# Patient Record
Sex: Male | Born: 1965 | Race: White | Hispanic: No | Marital: Married | Smoking: Former smoker
Health system: Southern US, Community
[De-identification: ages and names within clinical notes are randomized; demographics above are authoritative.]

## PROBLEM LIST (undated history)

## (undated) DIAGNOSIS — R7301 Impaired fasting glucose: Secondary | ICD-10-CM

## (undated) DIAGNOSIS — Z8701 Personal history of pneumonia (recurrent): Secondary | ICD-10-CM

## (undated) DIAGNOSIS — I10 Essential (primary) hypertension: Secondary | ICD-10-CM

## (undated) DIAGNOSIS — E7800 Pure hypercholesterolemia, unspecified: Secondary | ICD-10-CM

## (undated) DIAGNOSIS — E669 Obesity, unspecified: Secondary | ICD-10-CM

## (undated) DIAGNOSIS — I639 Cerebral infarction, unspecified: Secondary | ICD-10-CM

## (undated) DIAGNOSIS — I7771 Dissection of carotid artery: Secondary | ICD-10-CM

## (undated) DIAGNOSIS — G4733 Obstructive sleep apnea (adult) (pediatric): Secondary | ICD-10-CM

## (undated) DIAGNOSIS — Z9989 Dependence on other enabling machines and devices: Secondary | ICD-10-CM

## (undated) DIAGNOSIS — Z8619 Personal history of other infectious and parasitic diseases: Secondary | ICD-10-CM

## (undated) HISTORY — DX: Personal history of pneumonia (recurrent): Z87.01

## (undated) HISTORY — DX: Dissection of carotid artery: I77.71

## (undated) HISTORY — DX: Personal history of other infectious and parasitic diseases: Z86.19

## (undated) HISTORY — DX: Impaired fasting glucose: R73.01

## (undated) HISTORY — PX: WISDOM TOOTH EXTRACTION: SHX21

## (undated) HISTORY — DX: Dependence on other enabling machines and devices: Z99.89

## (undated) HISTORY — DX: Gilbert syndrome: E80.4

## (undated) HISTORY — DX: Pure hypercholesterolemia, unspecified: E78.00

## (undated) HISTORY — PX: CHOLECYSTECTOMY: SHX55

## (undated) HISTORY — PX: VASECTOMY: SHX75

## (undated) HISTORY — DX: Obstructive sleep apnea (adult) (pediatric): G47.33

## (undated) HISTORY — DX: Obesity, unspecified: E66.9

---

## 2014-04-22 HISTORY — PX: ELECTROCARDIOGRAM: SHX264

## 2014-04-27 DIAGNOSIS — I639 Cerebral infarction, unspecified: Secondary | ICD-10-CM

## 2014-04-27 HISTORY — DX: Cerebral infarction, unspecified: I63.9

## 2014-04-27 HISTORY — PX: OTHER SURGICAL HISTORY: SHX169

## 2014-08-23 HISTORY — PX: CAROTID STENT: SHX1301

## 2015-04-29 LAB — CBC AND DIFFERENTIAL
HCT: 45 (ref 41–53)
Platelets: 182 (ref 150–399)

## 2016-11-17 ENCOUNTER — Emergency Department
Admission: EM | Admit: 2016-11-17 | Discharge: 2016-11-17 | Disposition: A | Discharge status: Home / Self Care | Payer: 59 | Source: Home / Self Care | Attending: Family Medicine | Admitting: Family Medicine

## 2016-11-17 ENCOUNTER — Encounter: Payer: Self-pay | Admitting: *Deleted

## 2016-11-17 ENCOUNTER — Emergency Department (INDEPENDENT_AMBULATORY_CARE_PROVIDER_SITE_OTHER): Payer: 59

## 2016-11-17 DIAGNOSIS — I10 Essential (primary) hypertension: Secondary | ICD-10-CM

## 2016-11-17 DIAGNOSIS — R0602 Shortness of breath: Secondary | ICD-10-CM | POA: Diagnosis not present

## 2016-11-17 DIAGNOSIS — R05 Cough: Secondary | ICD-10-CM

## 2016-11-17 DIAGNOSIS — R053 Chronic cough: Secondary | ICD-10-CM

## 2016-11-17 DIAGNOSIS — J9801 Acute bronchospasm: Secondary | ICD-10-CM

## 2016-11-17 HISTORY — DX: Essential (primary) hypertension: I10

## 2016-11-17 HISTORY — DX: Cerebral infarction, unspecified: I63.9

## 2016-11-17 MED ORDER — PREDNISONE 20 MG PO TABS: ORAL_TABLET | ORAL | 0 refills | Status: DC

## 2016-11-17 MED ORDER — AZITHROMYCIN 250 MG PO TABS
ORAL_TABLET | ORAL | 0 refills | Status: DC
Start: 2016-11-17 — End: 2016-12-11

## 2016-11-17 NOTE — ED Provider Notes (Signed)
[facility]    CSN: [id number] Arrival date & time: 11/17/16  1706     History   Chief Complaint Chief Complaint  Patient presents with  . Cough    HPI Mark Gilmore is a 51 y.o. male.   Patient developed a cold-like illness about a month ago with typical initial symptoms of sore throat and sinus congestion followed by a cough about 4 days later.  However, the cough has persisted although he feels well otherwise.  No fevers, chills, and sweats.  No pleuritic pain, although he reports occasional shortness of breath.   No history of asthma, although his daughter has asthma.  He has even used his daughter's albuterol nebulizer with improvement.  He has a history of bronchitis and pneumonia.   The history is provided by the patient.    Past Medical History:  Diagnosis Date  . Hypertension   . Stroke [facility](H[facility])     There are no active problems to display for this patient.   Past Surgical History:  Procedure Laterality Date  . CAROTID STENT         Home Medications    Prior to Admission medications   Medication Sig Start Date End Date Taking? Authorizing Provider  amLODipine (NORVASC) 5 MG tablet Take 5 mg by mouth daily.   Yes [provider]  hydrochlorothiazide (HYDRODIURIL) 25 MG tablet Take 25 mg by mouth daily.   Yes [provider]  lisinopril (PRINIVIL,ZESTRIL) 40 MG tablet Take 40 mg by mouth daily.   Yes [provider]  azithromycin (ZITHROMAX Z-PAK) 250 MG tablet Take 2 tabs today; then begin one tab once daily for 4 more days. 11/17/16   Lattie HawBeese, Natoshia Souter A, MD  predniSONE (DELTASONE) 20 MG tablet Take one tab by mouth twice daily for 5 days, then one daily. Take with food. 11/17/16   Lattie HawBeese, Kimala Horne A, MD    Family History Family History  Problem Relation Age of Onset  . Hypertension Mother   . Diabetes Mother   . Hypertension Father   . Diabetes Father     Social History Social History  Substance Use Topics  .  Smoking status: Former Smoker    Quit date: 11/17/1996  . Smokeless tobacco: Never Used  . Alcohol use No     Allergies   Patient has no known allergies.   Review of Systems Review of Systems  No sore throat + cough No pleuritic pain No wheezing + nasal congestion, improved No post-nasal drainage No sinus pain/pressure No itchy/red eyes No earache No hemoptysis + SOB No fever/chills No nausea No vomiting No abdominal pain No diarrhea No urinary symptoms No skin rash No fatigue No myalgias No headache Used OTC meds without relief    Physical Exam Triage Vital Signs ED Triage Vitals [11/17/16 1738]  Enc Vitals Group     BP 114/80     Pulse Rate 77     Resp 18     Temp 98.5 F (36.9 C)     Temp Source Oral     SpO2 97 %     Weight 204 lb (92.5 kg)     Height 5\' 8"  (1.727 m)     Head Circumference      Peak Flow      Pain Score 0     Pain Loc      Pain Edu?      Excl. in GC?    No data found.   Updated Vital Signs  BP 114/80 (BP Location: Left Arm)   Pulse 77   Temp 98.5 F (36.9 C) (Oral)   Resp 18   Ht 5\' 8"  (1.727 m)   Wt 204 lb (92.5 kg)   SpO2 97%   BMI 31.02 kg/m   Visual Acuity Right Eye Distance:   Left Eye Distance:   Bilateral Distance:    Right Eye Near:   Left Eye Near:    Bilateral Near:     Physical Exam Nursing notes and Vital Signs reviewed. Appearance:  Patient appears stated age, and in no acute distress Eyes:  Pupils are equal, round, and reactive to light and accomodation.  Extraocular movement is intact.  Conjunctivae are not inflamed  Ears:  Canals normal.  Tympanic membranes normal.  Nose:  Mildly congested turbinates.  No sinus tenderness.   Pharynx:  Normal Neck:  Supple.  No adenopathy.  Lungs:  Clear to auscultation.  Breath sounds are equal.  Moving air well. Heart:  Regular rate and rhythm without murmurs, rubs, or gallops.  Abdomen:  Nontender without masses or hepatosplenomegaly.  Bowel sounds are  present.  No CVA or flank tenderness.  Extremities:  No edema.  Skin:  No rash present.    UC Treatments / Results  Labs (all labs ordered are listed, but only abnormal results are displayed) Labs Reviewed - No data to display  EKG  EKG Interpretation None       Radiology No results found.  Procedures Procedures (including critical care time)  Medications Ordered in UC Medications - No data to display   Initial Impression / Assessment and Plan / UC Course  I have reviewed the triage vital signs and the nursing notes.  Pertinent labs & imaging results that were available during my care of the patient were reviewed by me and considered in my medical decision making (see chart for details).    Begin empiric Z-pak for atypical coverage, and prednisone burst/taper. Take plain guaifenesin (1200mg  extended release tabs such as Mucinex) twice daily, with plenty of water, for cough and congestion.  Get adequate rest.   May take Delsym Cough Suppressant at bedtime for nighttime cough.  Stop all antihistamines for now, and other non-prescription cough/cold preparations.   Follow-up with family doctor if not improving about10 days.     Final Clinical Impressions(s) / UC Diagnoses   Final diagnoses:  Persistent cough for 3 weeks or longer  Bronchospasm    New Prescriptions Discharge Medication List as of 11/17/2016  6:24 PM    START taking these medications   Details  azithromycin (ZITHROMAX Z-PAK) 250 MG tablet Take 2 tabs today; then begin one tab once daily for 4 more days., Normal    predniSONE (DELTASONE) 20 MG tablet Take one tab by mouth twice daily for 5 days, then one daily. Take with food., Normal            Lattie Haw, MD 11/21/16 [phone]

## 2016-11-17 NOTE — ED Triage Notes (Signed)
Pt c/o productive cough and SOB x 1 mth. Denies fever. He has used his sons Albuterol nebs. He reports a hx of bronchitis and pneumonia.

## 2016-11-17 NOTE — Discharge Instructions (Signed)
Take plain guaifenesin (1200mg  extended release tabs such as Mucinex) twice daily, with plenty of water, for cough and congestion.  Get adequate rest.   May take Delsym Cough Suppressant at bedtime for nighttime cough.  Stop all antihistamines for now, and other non-prescription cough/cold preparations.   Follow-up with family doctor if not improving about10 days.

## 2016-12-11 ENCOUNTER — Encounter: Payer: Self-pay | Admitting: Family Medicine

## 2016-12-11 ENCOUNTER — Other Ambulatory Visit: Payer: Self-pay

## 2016-12-11 ENCOUNTER — Ambulatory Visit: Payer: 59 | Admitting: Family Medicine

## 2016-12-11 VITALS — BP 102/68 | HR 89 | Temp 98.0°F | Resp 16 | Ht 67.5 in | Wt 205.5 lb

## 2016-12-11 DIAGNOSIS — Z95828 Presence of other vascular implants and grafts: Secondary | ICD-10-CM

## 2016-12-11 DIAGNOSIS — Z9889 Other specified postprocedural states: Secondary | ICD-10-CM | POA: Diagnosis not present

## 2016-12-11 DIAGNOSIS — I10 Essential (primary) hypertension: Secondary | ICD-10-CM | POA: Diagnosis not present

## 2016-12-11 DIAGNOSIS — Z8673 Personal history of transient ischemic attack (TIA), and cerebral infarction without residual deficits: Secondary | ICD-10-CM

## 2016-12-11 DIAGNOSIS — Z9989 Dependence on other enabling machines and devices: Secondary | ICD-10-CM

## 2016-12-11 DIAGNOSIS — G4733 Obstructive sleep apnea (adult) (pediatric): Secondary | ICD-10-CM

## 2016-12-11 DIAGNOSIS — Z8679 Personal history of other diseases of the circulatory system: Secondary | ICD-10-CM | POA: Diagnosis not present

## 2016-12-11 NOTE — Progress Notes (Signed)
Office Note 12/11/2016  CC:  Chief Complaint  Patient presents with  . Establish Care  . Referral    Neurologist for CVA f/u, Cardiologist, Pulmonologist for Sleep Apnea/Sleep Study?  . Follow-up    [facility]   HPI:  Mark Gilmore is a 51 y.o. male who is here to establish care Patient's most recent primary [facility], Dr. Danne HarborNestleroad. Old records were not reviewed prior to or during today's visit.  No acute complaints. 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 Has had cardiology monitoring with carotid dopplers annually since then.  As far as pt knows, these dopplers have been normal/stable.  Says bp has historically been well controlled ever since CVA, but says prior to it he feels like it was likely not well controlled.  Denies hx of hyperlipidemia or diabetes.  Smoked 1/2 ppd for about 5 yrs in remote past.   Has OSA and is on CPAP--says he needs rx for new supplies/mask.  Most recent CPE with labs was about 1 yr ago. He states he wants to return at a different date to get CPE with fasting labs.  Past Medical History:  Diagnosis Date  . Hypertension   . OSA on CPAP   . Stroke (HCC) 04/2014   difficulty with word search, tightness in R arm.  Some residual spasticity in R arm.    Past Surgical History:  Procedure Laterality Date  . CAROTID STENT Left   . CHOLECYSTECTOMY  2006?    Family History  Problem Relation Age of Onset  . Hypertension Mother   . Diabetes Mother   . Stroke Mother   . Dementia Mother   . Hypertension Father   . Diabetes Father   . Hypertension Sister   . Diabetes Sister     Social History   Socioeconomic History  . Marital status: Married    Spouse name: Not on file  . Number of children: Not on file  . Years of education: Not on file  . Highest education level: Not on file  Social Needs  . Financial resource strain: Not on file  . Food insecurity - worry:  Not on file  . Food insecurity - inability: Not on file  . Transportation needs - medical: Not on file  . Transportation needs - non-medical: Not on file  Occupational History  . Not on file  Tobacco Use  . Smoking status: Former Smoker    Last attempt to quit: 11/17/1996    Years since quitting: 20.0  . Smokeless tobacco: Former Engineer, waterUser  Substance and Sexual Activity  . Alcohol use: No  . Drug use: No  . Sexual activity: Not on file  Other Topics Concern  . Not on file  Social History Narrative   Married, 4 kids.   [place].   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   Tob: 5 pack yr hx, quit about 1998.   No alcohol or drugs.    Outpatient Encounter Medications as of 12/11/2016  Medication Sig  . amLODipine (NORVASC) 5 MG tablet Take 5 mg by mouth daily.  Marland Kitchen. aspirin EC 81 MG tablet Take 81 mg 2 (two) times daily by mouth.  . hydrochlorothiazide (HYDRODIURIL) 25 MG tablet Take 25 mg by mouth daily.  Marland Kitchen. lisinopril (PRINIVIL,ZESTRIL) 40 MG tablet Take 40 mg by mouth daily.  . [DISCONTINUED] azithromycin (ZITHROMAX Z-PAK)  250 MG tablet Take 2 tabs today; then begin one tab once daily for 4 more days. (Patient not taking: Reported on 12/11/2016)  . [DISCONTINUED] predniSONE (DELTASONE) 20 MG tablet Take one tab by mouth twice daily for 5 days, then one daily. Take with food. (Patient not taking: Reported on 12/11/2016)   No facility-administered encounter medications on file as of 12/11/2016.     No Known Allergies  ROS Review of Systems  Constitutional: Negative for fatigue and fever.  HENT: Negative for congestion and sore throat.   Eyes: Negative for visual disturbance.  Respiratory: Negative for cough and shortness of breath.   Cardiovascular: Negative for chest pain and leg swelling.  Gastrointestinal: Negative for abdominal pain and nausea.  Genitourinary: Negative for dysuria.  Musculoskeletal: Negative for  back pain and joint swelling.  Skin: Negative for rash.  Neurological: Negative for dizziness, speech difficulty, weakness, numbness and headaches.  Hematological: Negative for adenopathy.    PE; Blood pressure 102/68, pulse 89, temperature 98 F (36.7 C), temperature source Oral, resp. rate 16, height 5' 7.5" (1.715 m), weight 205 lb 8 oz (93.2 kg), SpO2 95 %. Gen: Alert, well appearing.  Patient is oriented to person, place, time, and situation. AFFECT: pleasant, lucid thought and speech. Neck: supple/nontender.  No LAD, mass, or TM.  Carotid pulses 1+ bilaterally, without bruits. CV: RRR, no m/r/g.   LUNGS: CTA bilat, nonlabored resps, good aeration in all lung fields. ABD: soft, NT/ND EXT: no clubbing, cyanosis, or edema.  SKIN: no pallor or jaundice.  Pertinent labs:  None  ASSESSMENT AND PLAN:   New pt; lots of old records to obtain.  1) History of left carotid artery dissection, with subsequent carotid stent placement--2016. Will refer to cardiologist for ongoing monitoring/management of this. Pt states his next carotid dopplers would be due around Feb 2019.  2) Hx of CVA: from carotid artery dissection.  Presumably was not found to have significant PAD or cerebrovascular atherosclerosis--he is not on a statin.  Will continue with RF management: goal bp <130/80, LDL <70, normal A1c, ongoing antiplatelet treatment.  Continue 162 mg ASA qd. Good bp control on amlod, hctz, lisin.  No refills needed at this time.  3) HTN: The current medical regimen is effective;  continue present plan and medications. We'll be checking lytes/cr at CPE that he'll schedule soon.  4) OSA on CPAP: needs new mask/supplies.  He'll need to give sleep center information so we can obtain sleep study report details, then [facility]  An After Visit Summary was printed and given to the patient.  Return for patient to make appt for fasting CPE at his convenience.  Signed:   Santiago BumpersPhil [name], MD           12/11/2016

## 2016-12-30 ENCOUNTER — Telehealth: Payer: Self-pay | Admitting: *Deleted

## 2016-12-30 ENCOUNTER — Encounter: Payer: Self-pay | Admitting: *Deleted

## 2016-12-30 ENCOUNTER — Encounter: Payer: Self-pay | Admitting: Family Medicine

## 2016-12-30 NOTE — Telephone Encounter (Signed)
[facility]   I reviewed records and abstracted information into pts chart.   Records have been placed on Dr. Samul DadaMcGowen's desk for review.

## 2017-01-05 ENCOUNTER — Ambulatory Visit: Payer: 59 | Admitting: Cardiovascular Disease

## 2017-01-06 ENCOUNTER — Other Ambulatory Visit: Payer: Self-pay | Admitting: Family Medicine

## 2017-02-02 ENCOUNTER — Encounter: Payer: Self-pay | Admitting: *Deleted

## 2017-02-02 ENCOUNTER — Ambulatory Visit: Payer: 59 | Admitting: Cardiovascular Disease

## 2017-02-02 VITALS — BP 121/84 | HR 70 | Ht 67.5 in | Wt 208.0 lb

## 2017-02-02 DIAGNOSIS — I10 Essential (primary) hypertension: Secondary | ICD-10-CM

## 2017-02-02 DIAGNOSIS — Z9989 Dependence on other enabling machines and devices: Secondary | ICD-10-CM | POA: Insufficient documentation

## 2017-02-02 DIAGNOSIS — I7771 Dissection of carotid artery: Secondary | ICD-10-CM

## 2017-02-02 DIAGNOSIS — G4733 Obstructive sleep apnea (adult) (pediatric): Secondary | ICD-10-CM | POA: Insufficient documentation

## 2017-02-02 DIAGNOSIS — E669 Obesity, unspecified: Secondary | ICD-10-CM | POA: Insufficient documentation

## 2017-02-02 NOTE — Assessment & Plan Note (Signed)
History of left carotid artery dissection April 2016 with stenting 3 months later. This is followed by Dubowitz ultrasound. [facility] [place]

## 2017-02-02 NOTE — Assessment & Plan Note (Signed)
History of obstructive sleep apnea on CPAP. 

## 2017-02-02 NOTE — Progress Notes (Signed)
02/02/2017 Georgina Pillion   August 08, 1965  [id number]  Primary Physician [name], Maryjean Morn, MD Primary Cardiologist: Runell Gess MD Nicholes Cal[place], [place]  HPI:  Mark Gilmore is a 52 y.o. mild to moderately overweight married Caucasian male father of 2 young daughters referred by Dr. Marvel Plan for cardiovascular evaluation because of a history of carotid artery dissection and subsequent stenting. 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 He recently relocated from [place] to [place] in August 2018. His only cardiac risk factor is treated hypertension. He does have obstructive sleep apnea on CPAP. Never had a heart attack. He did have a spontaneous left carotid dissection April 2016 was on Coumadin for a short period time. He then underwent coronary artery stenting in July of that year [facility]. This followed noninvasively since then he has been asymptomatic with only mild right upper extremity residual.    Current Meds  Medication Sig  . amLODipine (NORVASC) 5 MG tablet TAKE 1 TABLET BY MOUTH EVERY DAY  . aspirin EC 81 MG tablet Take 81 mg 2 (two) times daily by mouth.  . hydrochlorothiazide (HYDRODIURIL) 25 MG tablet Take 25 mg by mouth daily.  Marland Kitchen lisinopril (PRINIVIL,ZESTRIL) 40 MG tablet Take 40 mg by mouth daily.     No Known Allergies  Social History   Socioeconomic History  . Marital status: Married    Spouse name: Not on file  . Number of children: Not on file  . Years of education: Not on file  . Highest education level: Not on file  Social Needs  . Financial resource strain: Not on file  . Food insecurity - worry: Not on file  . Food insecurity - inability: Not on file  . Transportation needs - medical: Not on file  . Transportation needs - non-medical: Not on file  Occupational History  . Not on file  Tobacco Use  . Smoking status: Former Smoker    Last attempt to quit: 11/17/1996    Years since  quitting: 20.2  . Smokeless tobacco: Former Engineer, water and Sexual Activity  . Alcohol use: No  . Drug use: No  . Sexual activity: Not on file  Other Topics Concern  . Not on file  Social History Narrative   Married, 4 kids.   Moved from [place] to [facility] 08/2016.   Educ: masters degree   Occup: Corporate investment banker.   Tob: 5 pack yr hx, quit about 1998.   No alcohol or drugs.     Review of Systems: General: negative for chills, fever, night sweats or weight changes.  Cardiovascular: negative for chest pain, dyspnea on exertion, edema, orthopnea, palpitations, paroxysmal nocturnal dyspnea or shortness of breath Dermatological: negative for rash Respiratory: negative for cough or wheezing Urologic: negative for hematuria Abdominal: negative for nausea, vomiting, diarrhea, bright red blood per rectum, melena, or hematemesis Neurologic: negative for visual changes, syncope, or dizziness All other systems reviewed and are otherwise negative except as noted above.    Blood pressure 121/84, pulse 70, height 5' 7.5" (1.715 m), weight 208 lb (94.3 kg).  General appearance: alert and no distress Neck: no adenopathy, no carotid bruit, no JVD, supple, symmetrical, trachea midline and thyroid not enlarged, symmetric, no tenderness/mass/nodules Lungs: clear to auscultation bilaterally Heart: regular rate and rhythm, S1, S2 normal, no murmur, click, rub or gallop Extremities: extremities normal, atraumatic, no cyanosis or edema Pulses: 2+ and symmetric  Skin: Skin color, texture, turgor normal. No rashes or lesions Neurologic: Alert and oriented X 3, normal strength and tone. Normal symmetric reflexes. Normal coordination and gait  EKG sinus rhythm at 70 with poor R-wave progression. I personally reviewed this EKG.  ASSESSMENT AND PLAN:   Hypertension History of essential hypertension blood pressure measured 121/84. He is on hydrochlorothiazide, amlodipine and  lisinopril. Continue current meds at current dosing.  Carotid artery dissection (HCC) History of left carotid artery dissection April 2016 with stenting 3 months later. This is followed by Dubowitz ultrasound. [facility] [place]  OSA on CPAP History of obstructive sleep apnea on CPAP.      Runell GessJonathan J. Xzayvion Vaeth MD FACP,FACC,FAHA, [facility] 02/02/2017 11:40 AM

## 2017-02-02 NOTE — Assessment & Plan Note (Signed)
History of essential hypertension blood pressure measured 121/84. He is on hydrochlorothiazide, amlodipine and lisinopril. Continue current meds at current dosing.

## 2017-02-02 NOTE — Patient Instructions (Signed)
Medication Instructions:  Continue current medications  If you need a refill on your cardiac medications before your next appointment, please call your pharmacy.  Labwork: None Ordered   Testing/Procedures: Your physician has requested that you have a carotid duplex. This test is an ultrasound of the carotid arteries in your neck. It looks at blood flow through these arteries that supply the brain with blood. Allow one hour for this exam. There are no restrictions or special instructions.   Follow-Up: Your physician wants you to follow-up in: 1 Year with Dr Allyson SabalBerry. You should receive a reminder letter in the mail two months in advance. If you do not receive a letter, please call our office [phone].   Thank you for choosing [facility]!!

## 2017-02-03 ENCOUNTER — Ambulatory Visit (INDEPENDENT_AMBULATORY_CARE_PROVIDER_SITE_OTHER): Payer: 59 | Admitting: Family Medicine

## 2017-02-03 ENCOUNTER — Encounter: Payer: Self-pay | Admitting: Family Medicine

## 2017-02-03 VITALS — BP 115/72 | HR 69 | Temp 97.4°F | Resp 16 | Ht 67.5 in | Wt 205.0 lb

## 2017-02-03 DIAGNOSIS — Z0000 Encounter for general adult medical examination without abnormal findings: Secondary | ICD-10-CM

## 2017-02-03 NOTE — Progress Notes (Signed)
Office Note 02/03/2017  CC:  Chief Complaint  Patient presents with  . Annual Exam    Pt is not fasting.    HPI:  Mark Gilmore is a 52 y.o. male who is here for annual health maintenance exam.  Saw Dr. Allyson SabalBerry yesterday, pt pleased.  Eyes: exam annually. Dental: preventatives q4530mo UTD. Exercise: minimal lately, has gained wt, says he'll get back into it soon. Diet: not working on anything at this time.  Past Medical History:  Diagnosis Date  . Carotid artery dissection (HCC)   . History of pneumonia   . History of shingles   . Hypertension   . Obesity   . OSA on CPAP   . Stroke (HCC) 04/2014   difficulty with word search, tightness in R arm.  Some residual spasticity in R arm.  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    Past Surgical History:  Procedure Laterality Date  . CAROTID STENT Left 08/23/2014  . CHOLECYSTECTOMY  2006?  . ELECTROCARDIOGRAM  04/22/2014   [facility] [place]  . VASECTOMY    . WISDOM TOOTH EXTRACTION      Family History  Problem Relation Age of Onset  . Hypertension Mother   . Diabetes Mother   . Stroke Mother [id number]       TIA's  . Dementia Mother   . Atrial fibrillation Mother        stent placement  . Alzheimer's disease Mother   . Hypertension Father   . Diabetes Father   . Hypertension Sister   . Diabetes Sister   . Thyroid disease Sister   . Kidney disease Maternal Grandmother        Dialysis  . CVA Maternal Grandmother   . Thyroid disease Maternal Grandmother   . Congestive Heart Failure Maternal Grandfather   . CVA Maternal Grandfather   . Deep vein thrombosis Paternal Grandmother   . Asthma Daughter   . Breast cancer Maternal Aunt [id number]    Social History   Socioeconomic History  . Marital status: Married    Spouse name: Not on file  . Number of children: Not on file  . Years of education: Not on file  . Highest education level: Not on file  Social Needs   . Financial resource strain: Not on file  . Food insecurity - worry: Not on file  . Food insecurity - inability: Not on file  . Transportation needs - medical: Not on file  . Transportation needs - non-medical: Not on file  Occupational History  . Not on file  Tobacco Use  . Smoking status: Former Smoker    Last attempt to quit: 11/17/1996    Years since quitting: 20.2  . Smokeless tobacco: Former Engineer, waterUser  Substance and Sexual Activity  . Alcohol use: No  . Drug use: No  . Sexual activity: Not on file  Other Topics Concern  . Not on file  Social History Narrative   Married, 4 kids.   [place]   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   Tob: 5 pack yr hx, quit about 1998.   No alcohol or drugs.    Outpatient Medications Prior to Visit  Medication Sig Dispense Refill  . amLODipine (NORVASC) 5 MG tablet TAKE 1 TABLET BY MOUTH EVERY DAY 90 tablet 1  . aspirin EC 81 MG tablet Take 81 mg 2 (two) times daily by mouth.    .Marland Kitchen  hydrochlorothiazide (HYDRODIURIL) 25 MG tablet Take 25 mg by mouth daily.    Marland Kitchen lisinopril (PRINIVIL,ZESTRIL) 40 MG tablet Take 40 mg by mouth daily.     No facility-administered medications prior to visit.     No Known Allergies  ROS Review of Systems  Constitutional: Negative for appetite change, chills, fatigue and fever.  HENT: Negative for congestion, dental problem, ear pain and sore throat.   Eyes: Negative for discharge, redness and visual disturbance.  Respiratory: Negative for cough, chest tightness, shortness of breath and wheezing.   Cardiovascular: Negative for chest pain, palpitations and leg swelling.  Gastrointestinal: Negative for abdominal pain, blood in stool, diarrhea, nausea and vomiting.  Genitourinary: Negative for difficulty urinating, dysuria, flank pain, frequency, hematuria and urgency.  Musculoskeletal: Negative for arthralgias, back pain, joint swelling, myalgias and  neck stiffness.  Skin: Negative for pallor and rash.  Neurological: Negative for dizziness, speech difficulty, weakness and headaches.  Hematological: Negative for adenopathy. Does not bruise/bleed easily.  Psychiatric/Behavioral: Negative for confusion and sleep disturbance. The patient is not nervous/anxious.     PE; Blood pressure 115/72, pulse 69, temperature (!) 97.4 F (36.3 C), temperature source Oral, resp. rate 16, height 5' 7.5" (1.715 m), weight 205 lb (93 kg), SpO2 96 %. Body mass index is 31.63 kg/m.  Gen: Alert, well appearing.  Patient is oriented to person, place, time, and situation. AFFECT: pleasant, lucid thought and speech. ENT: Ears: EACs clear, normal epithelium.  TMs with good light reflex and landmarks bilaterally.  Eyes: no injection, icteris, swelling, or exudate.  EOMI, PERRLA. Nose: no drainage or turbinate edema/swelling.  No injection or focal lesion.  Mouth: lips without lesion/swelling.  Oral mucosa pink and moist.  Dentition intact and without obvious caries or gingival swelling.  Oropharynx without erythema, exudate, or swelling.  Neck: supple/nontender.  No LAD, mass, or TM.  Carotid pulses 2+ bilaterally, without bruits. CV: RRR, no m/r/g.   LUNGS: CTA bilat, nonlabored resps, good aeration in all lung fields. ABD: soft, NT, ND, BS normal.  No hepatospenomegaly or mass.  No bruits. EXT: no clubbing, cyanosis, or edema.  Musculoskeletal: no joint swelling, erythema, warmth, or tenderness.  ROM of all joints intact. Skin - no sores or suspicious lesions or rashes or color changes   Pertinent labs:  No results found for: TSH Lab Results  Component Value Date   HCT 45 04/29/2015   PLT 182 04/29/2015   No results found for: CREATININE, BUN, NA, K, CL, CO2 No results found for: ALT, AST, GGT, ALKPHOS, BILITOT No results found for: CHOL No results found for: HDL No results found for: LDLCALC No results found for: TRIG No results found for:  CHOLHDL No results found for: PSA   ASSESSMENT AND PLAN:   Health maintenance exam: Reviewed age and gender appropriate health maintenance issues (prudent diet, regular exercise, health risks of tobacco and excessive alcohol, use of seatbelts, fire alarms in home, use of sunscreen).  Also reviewed age and gender appropriate health screening as well as vaccine recommendations. Vaccines: Tdap--waiting for old PCP records.   Flu ---already had it. Labs: fasting HP Prostate ca screening: avg risk=start at age 73 yrs. Colon ca screening: avg risk=start at age 86 yrs.  An After Visit Summary was printed and given to the patient.  FOLLOW UP:  Return in about 6 months (around 08/03/2017) for routine chronic illness f/u---also lab visit at pt's convenience for fasting CPE labs (ordered).  Signed:  Santiago Bumpers, MD  02/03/2017   

## 2017-02-03 NOTE — Patient Instructions (Signed)

## 2017-02-04 ENCOUNTER — Other Ambulatory Visit (INDEPENDENT_AMBULATORY_CARE_PROVIDER_SITE_OTHER): Payer: 59

## 2017-02-04 DIAGNOSIS — Z0000 Encounter for general adult medical examination without abnormal findings: Secondary | ICD-10-CM

## 2017-02-04 LAB — LIPID PANEL
CHOLESTEROL: 192 mg/dL (ref 0–200)
HDL: 45.5 mg/dL (ref >39.00)
LDL Cholesterol: 125 mg/dL — ABNORMAL HIGH (ref 0–99)
NonHDL: 146.07
TRIGLYCERIDES: 107 mg/dL (ref 0.0–149.0)
Total CHOL/HDL Ratio: 4
VLDL: 21.4 mg/dL (ref 0.0–40.0)

## 2017-02-04 LAB — TSH: TSH: 1.5 u[IU]/mL (ref 0.35–4.50)

## 2017-02-04 LAB — COMPREHENSIVE METABOLIC PANEL
ALBUMIN: 4.7 g/dL (ref 3.5–5.2)
ALK PHOS: 47 U/L (ref 39–117)
ALT: 36 U/L (ref 0–53)
AST: 18 U/L (ref 0–37)
BUN: 22 mg/dL (ref 6–23)
CHLORIDE: 104 meq/L (ref 96–112)
CO2: 25 mEq/L (ref 19–32)
Calcium: 9.6 mg/dL (ref 8.4–10.5)
Creatinine, Ser: 0.95 mg/dL (ref 0.40–1.50)
GFR: 89.35 mL/min (ref >60.00)
Glucose, Bld: 109 mg/dL — ABNORMAL HIGH (ref 70–99)
POTASSIUM: 3.8 meq/L (ref 3.5–5.1)
Sodium: 139 mEq/L (ref 135–145)
Total Bilirubin: 1.8 mg/dL — ABNORMAL HIGH (ref 0.2–1.2)
Total Protein: 7 g/dL (ref 6.0–8.3)

## 2017-02-04 LAB — CBC WITH DIFFERENTIAL/PLATELET
BASOS PCT: 1.4 % (ref 0.0–3.0)
Basophils Absolute: 0.1 10*3/uL (ref 0.0–0.1)
EOS PCT: 2.9 % (ref 0.0–5.0)
Eosinophils Absolute: 0.2 10*3/uL (ref 0.0–0.7)
HCT: 49.5 % (ref 39.0–52.0)
Hemoglobin: 17.2 g/dL — ABNORMAL HIGH (ref 13.0–17.0)
LYMPHS ABS: 2.4 10*3/uL (ref 0.7–4.0)
Lymphocytes Relative: 34.8 % (ref 12.0–46.0)
MCHC: 34.7 g/dL (ref 30.0–36.0)
MCV: 90.9 fl (ref 78.0–100.0)
MONO ABS: 0.4 10*3/uL (ref 0.1–1.0)
MONOS PCT: 5.8 % (ref 3.0–12.0)
NEUTROS PCT: 55.1 % (ref 43.0–77.0)
Neutro Abs: 3.7 10*3/uL (ref 1.4–7.7)
Platelets: 225 10*3/uL (ref 150.0–400.0)
RBC: 5.45 Mil/uL (ref 4.22–5.81)
RDW: 13.3 % (ref 11.5–15.5)
WBC: 6.8 10*3/uL (ref 4.0–10.5)

## 2017-02-05 ENCOUNTER — Encounter: Payer: Self-pay | Admitting: *Deleted

## 2017-02-08 ENCOUNTER — Ambulatory Visit (HOSPITAL_COMMUNITY)
Admission: RE | Admit: 2017-02-08 | Discharge: 2017-02-08 | Disposition: A | Discharge status: Home / Self Care | Payer: 59 | Source: Ambulatory Visit | Attending: Internal Medicine | Admitting: Internal Medicine

## 2017-02-08 DIAGNOSIS — Z87891 Personal history of nicotine dependence: Secondary | ICD-10-CM | POA: Insufficient documentation

## 2017-02-08 DIAGNOSIS — Z8673 Personal history of transient ischemic attack (TIA), and cerebral infarction without residual deficits: Secondary | ICD-10-CM | POA: Diagnosis not present

## 2017-02-08 DIAGNOSIS — I10 Essential (primary) hypertension: Secondary | ICD-10-CM | POA: Insufficient documentation

## 2017-02-08 DIAGNOSIS — I7771 Dissection of carotid artery: Secondary | ICD-10-CM

## 2017-02-08 DIAGNOSIS — Z9889 Other specified postprocedural states: Secondary | ICD-10-CM | POA: Diagnosis not present

## 2017-02-08 DIAGNOSIS — I6522 Occlusion and stenosis of left carotid artery: Secondary | ICD-10-CM | POA: Diagnosis not present

## 2017-02-09 ENCOUNTER — Encounter: Payer: Self-pay | Admitting: Family Medicine

## 2017-02-10 ENCOUNTER — Other Ambulatory Visit: Payer: Self-pay | Admitting: Cardiovascular Disease

## 2017-02-10 DIAGNOSIS — I7771 Dissection of carotid artery: Secondary | ICD-10-CM

## 2017-02-24 ENCOUNTER — Ambulatory Visit: Payer: 59 | Admitting: Cardiovascular Disease

## 2017-04-21 ENCOUNTER — Other Ambulatory Visit: Payer: Self-pay

## 2017-04-21 MED ORDER — AMLODIPINE BESYLATE 5 MG PO TABS: 5.0000 mg | ORAL_TABLET | Freq: Every day | ORAL | 3 refills | Status: DC

## 2017-04-21 MED ORDER — LISINOPRIL 40 MG PO TABS: 40.0000 mg | ORAL_TABLET | Freq: Every day | ORAL | 3 refills | Status: DC

## 2017-04-21 MED ORDER — HYDROCHLOROTHIAZIDE 25 MG PO TABS
25.0000 mg | ORAL_TABLET | Freq: Every day | ORAL | 3 refills | Status: DC
Start: 2017-04-21 — End: 2018-03-04

## 2017-05-04 ENCOUNTER — Encounter: Payer: Self-pay | Admitting: Family Medicine

## 2017-05-05 NOTE — Telephone Encounter (Signed)
This documentation that he attached does not the necessary information for me to prescribe him CPAP. Essentially all this paper does is state a diagnosis of OSA. We must have a copy of the actual sleep study that documents that he has OSA to a level requiring CPAP, and documentation of the recommended CPAP settings. -thx

## 2017-05-05 NOTE — Telephone Encounter (Signed)
Please advise. Thanks.  

## 2017-06-01 ENCOUNTER — Encounter: Payer: Self-pay | Admitting: Family Medicine

## 2017-06-01 DIAGNOSIS — Z9989 Dependence on other enabling machines and devices: Secondary | ICD-10-CM

## 2017-06-01 DIAGNOSIS — G4733 Obstructive sleep apnea (adult) (pediatric): Secondary | ICD-10-CM

## 2017-06-01 NOTE — Telephone Encounter (Signed)
Please email pt a blank medical release form so he can sign and email back to [place]. We need to get a copy of his sleep study. Thanks.

## 2017-07-14 NOTE — Telephone Encounter (Signed)
Received sleep study records for pt. These records have been placed on Dr. Verdis FredericksonMcGowens desk for review.

## 2017-07-19 ENCOUNTER — Encounter: Payer: Self-pay | Admitting: Family Medicine

## 2017-07-19 NOTE — Telephone Encounter (Signed)
I [facility] the [facility] (home sleep study + compliance/monitoring f/u 07/20/14). I will now refer him to neurologist who is a sleep medicine specialist to start managing his sleep apnea and help with any equipment needs (I do not manage obstructive sleep apnea). Pls forward a copy of his sleep clinic records to the specialist I referred him to [name])-thx.

## 2017-07-20 NOTE — Telephone Encounter (Signed)
Please fax records as requested. Thanks.

## 2017-08-09 ENCOUNTER — Ambulatory Visit: Payer: 59 | Admitting: Family Medicine

## 2017-08-12 ENCOUNTER — Encounter: Payer: Self-pay | Admitting: *Deleted

## 2017-08-13 ENCOUNTER — Encounter: Payer: Self-pay | Admitting: Family Medicine

## 2017-08-13 ENCOUNTER — Ambulatory Visit: Payer: 59 | Admitting: Family Medicine

## 2017-08-13 VITALS — BP 112/79 | HR 62 | Temp 97.7°F | Resp 16 | Ht 67.5 in | Wt 203.0 lb

## 2017-08-13 DIAGNOSIS — I10 Essential (primary) hypertension: Secondary | ICD-10-CM

## 2017-08-13 LAB — BASIC METABOLIC PANEL
BUN: 20 mg/dL (ref 6–23)
CO2: 28 meq/L (ref 19–32)
CREATININE: 0.96 mg/dL (ref 0.40–1.50)
Calcium: 9.5 mg/dL (ref 8.4–10.5)
Chloride: 105 mEq/L (ref 96–112)
GFR: 88.09 mL/min (ref >60.00)
GLUCOSE: 89 mg/dL (ref 70–99)
Potassium: 3.7 mEq/L (ref 3.5–5.1)
Sodium: 141 mEq/L (ref 135–145)

## 2017-08-13 NOTE — Progress Notes (Signed)
OFFICE VISIT  08/13/2017   CC:  Chief Complaint  Patient presents with  . Follow-up    RCI     HPI:    Patient is a 52 y.o. Caucasian male who presents for 6 mo f/u HTN.  HTN: home monitoring consistently <130/80. No exercise.  ROS: no CP, no SOB, no wheezing, no cough, no dizziness, no HAs, no rashes, no melena/hematochezia.  No polyuria or polydipsia.  No myalgias or arthralgias.   Past Medical History:  Diagnosis Date  . Carotid artery dissection (HCC)   . History of pneumonia   . History of shingles   . Hypertension   . Obesity   . OSA on CPAP    Dx'd by home sleep study 04/2014.  On auto PAP since that time; most recent compliance monitoring 07/20/14 [facility], [place], [place]).  Marland Kitchen Stroke (HCC) 04/2014   difficulty with word search, tightness in R arm.  Some residual spasticity in R arm.  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    Past Surgical History:  Procedure Laterality Date  . CAROTID STENT Left 08/23/2014   L carotid stent widely patent on carotid u/s by Dr. Allyson Sabal 02/08/17--repeat 1 yr  . CHOLECYSTECTOMY  2006?  . ELECTROCARDIOGRAM  04/22/2014   [facility], [place]  . VASECTOMY    . WISDOM TOOTH EXTRACTION      Outpatient Medications Prior to Visit  Medication Sig Dispense Refill  . amLODipine (NORVASC) 5 MG tablet Take 1 tablet (5 mg total) by mouth daily. 90 tablet 3  . aspirin EC 81 MG tablet Take 81 mg 2 (two) times daily by mouth.    . hydrochlorothiazide (HYDRODIURIL) 25 MG tablet Take 1 tablet (25 mg total) by mouth daily. 90 tablet 3  . lisinopril (PRINIVIL,ZESTRIL) 40 MG tablet Take 1 tablet (40 mg total) by mouth daily. 90 tablet 3   No facility-administered medications prior to visit.     No Known Allergies  ROS As per HPI  PE: Blood pressure 112/79, pulse 62, temperature 97.7 F (36.5 C), temperature source Oral, resp. rate 16, height 5' 7.5" (1.715 m), weight  203 lb (92.1 kg), SpO2 97 %. Body mass index is 31.33 kg/m.  Gen: Alert, well appearing.  Patient is oriented to person, place, time, and situation. AFFECT: pleasant, lucid thought and speech. CV: RRR, no m/r/g.   LUNGS: CTA bilat, nonlabored resps, good aeration in all lung fields. EXT: no clubbing, cyanosis, or edema.    LABS:  Lab Results  Component Value Date   TSH 1.50 02/04/2017   Lab Results  Component Value Date   WBC 6.8 02/04/2017   HGB 17.2 (H) 02/04/2017   HCT 49.5 02/04/2017   MCV 90.9 02/04/2017   PLT 225.0 02/04/2017   Lab Results  Component Value Date   CREATININE 0.95 02/04/2017   BUN 22 02/04/2017   NA 139 02/04/2017   K 3.8 02/04/2017   CL 104 02/04/2017   CO2 25 02/04/2017   Lab Results  Component Value Date   ALT 36 02/04/2017   AST 18 02/04/2017   ALKPHOS 47 02/04/2017   BILITOT 1.8 (H) 02/04/2017   Lab Results  Component Value Date   CHOL 192 02/04/2017   Lab Results  Component Value Date   HDL 45.50 02/04/2017   Lab Results  Component Value Date   LDLCALC 125 (H) 02/04/2017   Lab Results  Component Value Date   TRIG 107.0 02/04/2017  Lab Results  Component Value Date   CHOLHDL 4 02/04/2017   No results found for: HGBA1C  IMPRESSION AND PLAN:  HTN: The current medical regimen is effective;  continue present plan and medications. BMET today. Exercise more.  An After Visit Summary was printed and given to the patient.  FOLLOW UP: Return in about 6 months (around 02/13/2018) for annual CPE (fasting).  Signed:  Santiago BumpersPhil Nasri Boakye, MD           08/13/2017

## 2017-08-16 ENCOUNTER — Encounter: Payer: Self-pay | Admitting: Neurology

## 2017-08-17 ENCOUNTER — Ambulatory Visit (INDEPENDENT_AMBULATORY_CARE_PROVIDER_SITE_OTHER): Payer: 59 | Admitting: Neurology

## 2017-08-17 ENCOUNTER — Encounter: Payer: Self-pay | Admitting: Neurology

## 2017-08-17 VITALS — BP 122/84 | HR 62 | Ht 67.0 in | Wt 202.0 lb

## 2017-08-17 DIAGNOSIS — G4733 Obstructive sleep apnea (adult) (pediatric): Secondary | ICD-10-CM

## 2017-08-17 DIAGNOSIS — Z9989 Dependence on other enabling machines and devices: Secondary | ICD-10-CM

## 2017-08-17 NOTE — Patient Instructions (Addendum)
You will need new supplies for your autoPAP.  Please try to be fully compliant with your autoPAP.  We will see you back in 3 months.

## 2017-08-17 NOTE — Progress Notes (Signed)
Subjective:    Patient ID: Mark Gilmore is a 52 y.o. male.  HPI     Huston FoleySaima Tonae Livolsi, MD, PhD [facility] [address], [address] [address] [place], [place] [address]  Dear Dr. Milinda CaveMcGowen,   I saw your patient, Mark Gilmore, upon your kind request, in my neurologic today, for initial consultation of his sleep disorder, in particular, re-evaluation of his prior diagnosis of obstructive sleep apnea. The patient is unaccompanied today. As you know, Mr. Mark Gilmore is a 52 year old right-handed gentleman with an underlying medical history of hypertension, left carotid artery dissection with subsequent left MCA stroke, history of pneumonia, history of shingles, and obesity, who was previously diagnosed with obstructive sleep apnea and placed on AutoPap therapy. [place]. Prior sleep study results were reviewed today. He had a home sleep test 05/17/2014 which showed mild obstructive sleep apnea with an RDI of 9.3 per hour, desaturation nadir of 83% and time below or at 88% saturation of 2.7 minutes. He was started on AutoPap therapy. I reviewed his AutoPap download from the last 30 days, during which time he used his AutoPap only 9 days, has not used his machine since fourth of July essentially. In the past 90 days, he used his AutoPap 14 days. 95th percentile pressure is 9.1 cm. He has not used his machine because he needs updated supplies. His Epworth sleepiness score is 3 out of 24, fatigue score is 30 out of 63. He is married and lives with his wife and 2 children. He quit smoking in 1999. He does not utilize alcohol on a regular basis and drinks caffeine in the form of coffee, 1-2 cups in the mornings. He does not have night to night nocturia. He has a family history of sleep apnea and his mother and sister, both of CPAP machines. He goes to bed around 10, rise time is around 6. He noticed improvement in his sleep quality and sleep consolidation  and of course snoring when he was consistently on the AutoPap machine. He has noticed more fatigue but is not frankly sleepy during the day. He works as an Art gallery managerengineer. He has 2 children, ages 5115 and 7714, home schooled. I reviewed your office note from 08/13/2017. He does not currently have a DME company. His weight has been stable.   His Past Medical History Is Significant For: Past Medical History:  Diagnosis Date  . Carotid artery dissection (HCC)   . History of pneumonia   . History of shingles   . Hypertension   . Obesity   . OSA on CPAP    Dx'd by home sleep study 04/2014.  On auto PAP since that time; most recent compliance monitoring 07/20/14 [facility] [place]  Marland Kitchen. Stroke (HCC) 04/2014   difficulty with word search, tightness in R arm.  Some residual spasticity in R arm.  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    His Past Surgical History Is Significant For: Past Surgical History:  Procedure Laterality Date  . CAROTID STENT Left 08/23/2014   L carotid stent widely patent on carotid u/s by Dr. Allyson SabalBerry 02/08/17--repeat 1 yr  . CHOLECYSTECTOMY  2006?  . ELECTROCARDIOGRAM  04/22/2014   [facility] [place]  . VASECTOMY    . WISDOM TOOTH EXTRACTION      His Family History Is Significant For: Family History  Problem Relation Age of Onset  . Hypertension Mother   .  Diabetes Mother   . Stroke Mother 86       TIA's  . Dementia Mother   . Atrial fibrillation Mother        stent placement  . Alzheimer's disease Mother   . Hypertension Father   . Diabetes Father   . Hypertension Sister   . Diabetes Sister   . Thyroid disease Sister   . Kidney disease Maternal Grandmother        Dialysis  . CVA Maternal Grandmother   . Thyroid disease Maternal Grandmother   . Congestive Heart Failure Maternal Grandfather   . CVA Maternal Grandfather   . Deep vein thrombosis Paternal Grandmother   . Asthma Daughter    . Breast cancer Maternal Aunt 72    His Social History Is Significant For: Social History   Socioeconomic History  . Marital status: Married    Spouse name: Not on file  . Number of children: Not on file  . Years of education: Not on file  . Highest education level: Not on file  Occupational History  . Not on file  Social Needs  . Financial resource strain: Not on file  . Food insecurity:    Worry: Not on file    Inability: Not on file  . Transportation needs:    Medical: Not on file    Non-medical: Not on file  Tobacco Use  . Smoking status: Former Smoker    Last attempt to quit: 11/17/1996    Years since quitting: 20.7  . Smokeless tobacco: Former Engineer, water and Sexual Activity  . Alcohol use: No  . Drug use: No  . Sexual activity: Not on file  Lifestyle  . Physical activity:    Days per week: Not on file    Minutes per session: Not on file  . Stress: Not on file  Relationships  . Social connections:    Talks on phone: Not on file    Gets together: Not on file    Attends religious service: Not on file    Active member of club or organization: Not on file    Attends meetings of clubs or organizations: Not on file    Relationship status: Not on file  Other Topics Concern  . Not on file  Social History Narrative   Married, 4 kids.   Moved from [place] to [place] 08/2016.   Educ: masters degree   Occup: Corporate investment banker.   Tob: 5 pack yr hx, quit about 1998.   No alcohol or drugs.    His Allergies Are:  No Known Allergies:   His Current Medications Are:  Outpatient Encounter Medications as of 08/17/2017  Medication Sig  . amLODipine (NORVASC) 5 MG tablet Take 1 tablet (5 mg total) by mouth daily.  Marland Kitchen aspirin EC 81 MG tablet Take 81 mg 2 (two) times daily by mouth.  . hydrochlorothiazide (HYDRODIURIL) 25 MG tablet Take 1 tablet (25 mg total) by mouth daily.  Marland Kitchen lisinopril (PRINIVIL,ZESTRIL) 40 MG tablet Take 1 tablet (40 mg  total) by mouth daily.   No facility-administered encounter medications on file as of 08/17/2017.   :  Review of Systems:  Out of a complete 14 point review of systems, all are reviewed and negative with the exception of these symptoms as listed below: Review of Systems  Neurological:       New patient to establish care. Patient was seen previously in [place]. Last sleep study was 07/20/2014. He does  not have a DME, CPAP was prescribed in 2016.   Epworth Sleepiness Scale 0= would never doze 1= slight chance of dozing 2= moderate chance of dozing 3= high chance of dozing  Sitting and reading:0 Watching TV:0 Sitting inactive in a public place (ex. Theater or meeting):0 As a passenger in a car for an hour without a break:1 Lying down to rest in the afternoon:2 Sitting and talking to someone:0 Sitting quietly after lunch (no alcohol):0 In a car, while stopped in traffic:0 Total:3     Objective:  Neurological Exam  Physical Exam Physical Examination:   Vitals:   08/17/17 1343  BP: 122/84  Pulse: 62   General Examination: The patient is a very pleasant 52 y.o. male in no acute distress. He appears well-developed and well-nourished and well groomed.   HEENT: Normocephalic, atraumatic, pupils are equal, round and reactive to light and accommodation. Corrective eyeglasses in place. Extraocular tracking is good without limitation to gaze excursion or nystagmus noted. Normal smooth pursuit is noted. Hearing is grossly intact. Face is symmetric with normal facial animation and normal facial sensation. Speech is clear with no dysarthria noted. There is no hypophonia. There is no lip, neck/head, jaw or voice tremor. Neck is supple with full range of passive and active motion. There are no carotid bruits on auscultation. Oropharynx exam reveals: mild mouth dryness, adequate dental hygiene and mild to moderate airway crowding secondary to smaller airway entry, tonsils of about 1+, redundant  soft palate. Mallampati is class II. Neck circumference is 15-3/4 inches. Tongue protrudes centrally and palate elevates symmetrically.  Chest: Clear to auscultation without wheezing, rhonchi or crackles noted.  Heart: S1+S2+0, regular and normal without murmurs, rubs or gallops noted.   Abdomen: Soft, non-tender and non-distended with normal bowel sounds appreciated on auscultation.  Extremities: There is no pitting edema in the distal lower extremities bilaterally.  Skin: Warm and dry without trophic changes noted.   Musculoskeletal: exam reveals no obvious joint deformities, tenderness or joint swelling or erythema.   Neurologically:  Mental status: The patient is awake, alert and oriented in all 4 spheres. His immediate and remote memory, attention, language skills and fund of knowledge are appropriate. There is no evidence of aphasia, agnosia, apraxia or anomia. Speech is clear with normal prosody and enunciation. Thought process is linear. Mood is normal and affect is normal.  Cranial nerves II - XII are as described above under HEENT exam. In addition: shoulder shrug is normal with equal shoulder height noted. Motor exam: Normal bulk, strength and tone is noted, perhaps slight increased tone and the right elbow. There is no drift, tremor or rebound. Romberg is negative. Reflexes are 1+. Fine motor skills and coordination: intact with normal finger taps, normal hand movements, normal rapid alternating patting, normal foot taps and normal foot agility.  Cerebellar testing: No dysmetria or intention tremor on finger to nose testing. There is no truncal or gait ataxia.  Sensory exam: intact to light touch in the upper and lower extremities.  Gait, station and balance: He stands easily. No veering to one side is noted. No leaning to one side is noted. Posture is age-appropriate and stance is narrow based. Gait shows normal stride length and normal pace. No problems turning are noted. Tandem  walk is unremarkable.   Assessment and Plan:  In summary, Armanii Pressnell is a very pleasant 52 y.o.-year old male with an underlying medical history of hypertension, left carotid artery dissection with subsequent left MCA stroke, history of  pneumonia, history of shingles, and obesity, who presents for initial consultation of his prior diagnosis of obstructive sleep apnea. [place] and had sleep study testing in April 2016 of the form of home sleep testing which indicated mild obstructive sleep apnea. He was placed on AutoPap therapy. He has not been able to use his AutoPap machine consistently because of lack of updated/pain supplies. I placed an order for her AutoPap supplies, settings are adequate between 6 cm and 15 cm with 95th percentile pressure at 9.1 cm. Leak has been on the lower end of the spectrum in the past, residual AHI less than 2 per hour on average. He is advised to be consistent with his AutoPap therapy and he indicates that he had benefited from treatment in the past. I suggested a three-month follow-up for a compliance recheck, he can see one of our nurse practitioners routinely. Thankfully, he has recuperated well after his carotid artery dissection and left MCA stroke with the only complaint of residual tightness in the right upper extremity. Once he is stable on AutoPap therapy, he can be seen on a yearly basis. I answered all his questions today and he was in agreement.   Thank you very much for allowing me to participate in the care of this nice patient. If I can be of any further assistance to you please do not hesitate to call me at [phone].  Sincerely,   Huston Foley, MD, PhD

## 2017-09-01 ENCOUNTER — Institutional Professional Consult (permissible substitution): Payer: Self-pay | Admitting: Neurology

## 2017-09-20 ENCOUNTER — Institutional Professional Consult (permissible substitution): Payer: Self-pay | Admitting: Neurology

## 2017-09-20 ENCOUNTER — Encounter

## 2017-11-17 ENCOUNTER — Ambulatory Visit: Payer: 59 | Admitting: Neurology

## 2017-12-16 ENCOUNTER — Ambulatory Visit: Payer: 59 | Admitting: Neurology

## 2017-12-28 ENCOUNTER — Ambulatory Visit: Payer: 59 | Admitting: Neurology

## 2017-12-28 ENCOUNTER — Encounter: Payer: Self-pay | Admitting: Neurology

## 2017-12-28 VITALS — BP 122/84 | HR 78 | Ht 67.0 in | Wt 206.0 lb

## 2017-12-28 DIAGNOSIS — Z9989 Dependence on other enabling machines and devices: Secondary | ICD-10-CM | POA: Diagnosis not present

## 2017-12-28 DIAGNOSIS — G4733 Obstructive sleep apnea (adult) (pediatric): Secondary | ICD-10-CM | POA: Diagnosis not present

## 2017-12-28 NOTE — Progress Notes (Signed)
Subjective:    Patient ID: Mark Gilmore is a 52 y.o. male.  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 The patient is unaccompanied today. I first met him on 08/17/2017 at the request of his primary care physician, at which time the patient reported a sleep apnea diagnosis without of state sleep study testing in April 2016. He was on AutoPap therapy, but needed new supplies. He was advised to be fully compliant. I ordered new supplies for his machine.   Today, 12/28/2017: I reviewed his CPAP compliance data from 11/26/2017 through 12/25/2017 which is a total of 30 days, during which time he used his AutoPap 18 days with percent used days greater than 4 hours at 60%, indicating slightly suboptimal compliance with an average usage of 6 hours and 32 minutes, residual AHI at goal at 2 per hour, leak acceptable with the 95th percentile at 6.6 L/m, 95th percentile pressure at 9.9 cm with a range of 6 cm to 15 cm. He reports that AutoPap is going well. He benefits from treatment, he tends not to take it with him when he travels but other than that he is compliant with treatment and motivated to continue with it. He saw cardiology earlier this year and is supposed to have a one-year checkup coming up in January 2020 with a carotid Doppler ultrasound also scheduled. Neurologically he has been stable, he tries to hydrate well with water and takes a baby aspirin, sometimes 2 a day.  The patient's allergies, current medications, family history, past medical history, past social history, past surgical history and problem list were reviewed and updated as appropriate.  Previously:   08/17/2017: (He) was previously diagnosed with obstructive  sleep apnea and placed on AutoPap therapy. [place] of [place] about [place] years ago. Prior sleep study results were reviewed today. He had a home sleep test 05/17/2014 which showed mild obstructive sleep apnea with an RDI of 9.3 per hour, desaturation nadir of 83% and time below or at 88% saturation of 2.7 minutes. He was started on AutoPap therapy. I reviewed his AutoPap download from the last 30 days, during which time he used his AutoPap only 9 days, has not used his machine since fourth of July essentially. In the past 90 days, he used his AutoPap 14 days. 95th percentile pressure is 9.1 cm. He has not used his machine because he needs updated supplies. His Epworth sleepiness score is 3 out of 24, fatigue score is 30 out of 63. He is married and lives with his wife and 2 children. He quit smoking in 1999. He does not utilize alcohol on a regular basis and drinks caffeine in the form of coffee, 1-2 cups in the mornings. He does not have night to night nocturia. He has a family history of sleep apnea and his mother and sister, both of CPAP machines. He goes to bed around 10, rise time is around 6. He noticed improvement in his sleep quality and sleep consolidation and of course snoring when he was consistently on the AutoPap machine. He has noticed more fatigue but is not frankly sleepy during the day. He works as an Chief Financial Officer. He has 2 children, ages 69 and 46, home schooled. I reviewed your office note  from 08/13/2017. He does not currently have a DME company. His weight has been stable.    His Past Medical History Is Significant For: Past Medical History:  Diagnosis Date  . Carotid artery dissection ([place])   . History of pneumonia   . History of shingles   . Hypertension   . Obesity   . OSA on CPAP    Dx'd by home sleep study 04/2014.  On auto PAP since that time; most recent compliance monitoring 07/20/14 [facility] [place], [place]).  Marland Kitchen Stroke ([place]) 04/2014   difficulty with  word search, tightness in R arm.  Some residual spasticity in R arm.  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    His Past Surgical History Is Significant For: Past Surgical History:  Procedure Laterality Date  . CAROTID STENT Left 08/23/2014   L carotid stent widely patent on carotid u/s by Dr. Gwenlyn Found 02/08/17--repeat 1 yr  . CHOLECYSTECTOMY  2006?  . ELECTROCARDIOGRAM  04/22/2014   [facility], [place]    . WISDOM TOOTH EXTRACTION      His Family History Is Significant For: Family History  Problem Relation Age of Onset  . Hypertension Mother   . Diabetes Mother   . Stroke Mother 59       TIA's  . Dementia Mother   . Atrial fibrillation Mother        stent placement  . Alzheimer's disease Mother   . Hypertension Father   . Diabetes Father   . Hypertension Sister   . Diabetes Sister   . Thyroid disease Sister   . Kidney disease Maternal Grandmother        Dialysis  . CVA Maternal Grandmother   . Thyroid disease Maternal Grandmother   . Congestive Heart Failure Maternal Grandfather   . CVA Maternal Grandfather   . Deep vein thrombosis Paternal Grandmother   . Asthma Daughter   . Breast cancer Maternal Aunt 58    His Social History Is Significant For: Social History   Socioeconomic History  . Marital status: Married    Spouse name: Not on file  . Number of children: Not on file  . Years of education: Not on file  . Highest education level: Not on file  Occupational History  . Not on file  Social Needs  . Financial resource strain: Not on file  . Food insecurity:    Worry: Not on file    Inability: Not on file  . Transportation needs:    Medical: Not on file    Non-medical: Not on file  Tobacco Use  . Smoking status: Former Smoker    Last attempt to quit: 11/17/1996    Years since quitting: 21.1  . Smokeless tobacco: Former Network engineer and Sexual Activity  . Alcohol use: No   . Drug use: No  . Sexual activity: Not on file  Lifestyle  . Physical activity:    Days per week: Not on file    Minutes per session: Not on file  . Stress: Not on file  Relationships  . Social connections:    Talks on phone: Not on file    Gets together: Not on file    Attends religious service: Not on file    Active member of club or organization: Not on file    Attends meetings of clubs or organizations: Not on file    Relationship status: Not on file  Other Topics  Concern  . Not on file  Social History Narrative   Married, 4 kids.   Moved from [place] to [facility] 08/2016.   Educ: masters degree   Occup: Psychologist, occupational.   Tob: 5 pack yr hx, quit about 1998.   No alcohol or drugs.    His Allergies Are:  No Known Allergies:   His Current Medications Are:  Outpatient Encounter Medications as of 12/28/2017  Medication Sig  . amLODipine (NORVASC) 5 MG tablet Take 1 tablet (5 mg total) by mouth daily.  Marland Kitchen aspirin EC 81 MG tablet Take 81 mg 2 (two) times daily by mouth.  . hydrochlorothiazide (HYDRODIURIL) 25 MG tablet Take 1 tablet (25 mg total) by mouth daily.  Marland Kitchen lisinopril (PRINIVIL,ZESTRIL) 40 MG tablet Take 1 tablet (40 mg total) by mouth daily.   No facility-administered encounter medications on file as of 12/28/2017.   :  Review of Systems:  Out of a complete 14 point review of systems, all are reviewed and negative with the exception of these symptoms as listed below: Review of Systems  Neurological:       Pt presents today to discuss his cpap. Pt reports that his cpap is going well.    Objective:  Neurological Exam  Physical Exam Physical Examination:   Vitals:   12/28/17 1122  BP: 122/84  Pulse: 78    General Examination: The patient is a very pleasant 52 y.o. male in no acute distress. He appears well-developed and well-nourished and well groomed.   HEENT: Normocephalic, atraumatic, pupils are equal, round and reactive to  light and accommodation. Corrective eyeglasses in place. Extraocular tracking is good without limitation to gaze excursion or nystagmus noted. Normal smooth pursuit is noted. Hearing is grossly intact. Face is symmetric with normal facial animation and normal facial sensation. Speech is clear with no dysarthria noted. There is no hypophonia. There is no lip, neck/head, jaw or voice tremor. Neck with FROM. Oropharynx exam reveals: mild to moderate mouth dryness, adequate dental hygiene and mild to moderate airway crowding. Tongue protrudes centrally and palate elevates symmetrically.  Chest: Clear to auscultation without wheezing, rhonchi or crackles noted.  Heart: S1+S2+0, regular and normal without murmurs, rubs or gallops noted.   Abdomen: Soft, non-tender and non-distended with normal bowel sounds appreciated on auscultation.  Extremities: There is no pitting edema in the distal lower extremities bilaterally.  Skin: Warm and dry without trophic changes noted.   Musculoskeletal: exam reveals no obvious joint deformities, tenderness or joint swelling or erythema.   Neurologically:  Mental status: The patient is awake, alert and oriented in all 4 spheres. His immediate and remote memory, attention, language skills and fund of knowledge are appropriate. There is no evidence of aphasia, agnosia, apraxia or anomia. Speech is clear with normal prosody and enunciation. Thought process is linear. Mood is normal and affect is normal.  Cranial nerves II - XII are as described above under HEENT exam. Motor exam: Normal bulk, strength and tone is noted. There is no tremor. Fine motor skills and coordination: grossly intact.  Cerebellar testing: No dysmetria or intention tremor on finger to nose testing. There is no truncal or gait ataxia.  Sensory exam: intact to light touch in the upper and lower extremities.  Gait, station and balance: He stands easily. No veering to one side is noted. No leaning  to one side is noted. Posture is age-appropriate and stance is narrow based. Gait shows normal stride length and normal  pace. No problems turning are noted.   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 He was diagnosed out of state in April 2016 of the form of home sleep testing which indicated mild obstructive sleep apnea. He was placed on AutoPap therapy. He has been able to Get updated supplies through a local DME company and has been generally speaking quite compliant. He travels for work quite a bit and does not tend to take his machine with him. He is up-to-date with his supplies at this time, sleep apnea is under control when he uses his machine and when he is on it he uses it for a good 6-1/2 hours which is commendable. His weight has been fairly stable, he is encouraged to pursue some weight loss. He takes a baby aspirin and has a once a year check up with cardiology which is coming up in January 2020 and he will have a carotid ultrasound as well. Neurologically he has been without any new symptoms thankfully. Exam is stable. I suggested a once a year check up in sleep clinic routinely, he can see one of our nurse practitioners next time. I answered all his questions today and he was in agreement. I spent 15 minutes in total face-to-face time with the patient, more than 50% of which was spent in counseling and coordination of care, reviewing test results, reviewing medication and discussing or reviewing the diagnosis of OSA, its prognosis and treatment options. Pertinent laboratory and imaging test results that were available during this visit with the patient were reviewed by me and considered in my  medical decision making (see chart for details).

## 2017-12-28 NOTE — Patient Instructions (Addendum)
Please continue using your autoPAP regularly. While your insurance requires that you use PAP at least 4 hours each night on 70% of the nights, I recommend, that you not skip any nights and use it throughout the night if you can. Getting used to PAP and staying with the treatment long term does take time and patience and discipline. Untreated obstructive sleep apnea when it is moderate to severe can have an adverse impact on cardiovascular health and raise her risk for heart disease, arrhythmias, hypertension, congestive heart failure, stroke and diabetes. Untreated obstructive sleep apnea causes sleep disruption, nonrestorative sleep, and sleep deprivation. This can have an impact on your day to day functioning and cause daytime sleepiness and impairment of cognitive function, memory loss, mood disturbance, and problems focussing. Using PAP regularly can improve these symptoms.   Keep up the good work! We can see you in 1 year, you can see one of our nurse practitioners as you are stable. I will see you after that.        

## 2018-01-03 ENCOUNTER — Ambulatory Visit (HOSPITAL_COMMUNITY)
Admission: RE | Admit: 2018-01-03 | Discharge: 2018-01-03 | Disposition: A | Discharge status: Home / Self Care | Payer: 59 | Source: Ambulatory Visit | Attending: Cardiology | Admitting: Cardiology

## 2018-01-03 ENCOUNTER — Other Ambulatory Visit: Payer: Self-pay | Admitting: *Deleted

## 2018-01-03 DIAGNOSIS — I7771 Dissection of carotid artery: Secondary | ICD-10-CM | POA: Diagnosis present

## 2018-01-05 ENCOUNTER — Encounter: Payer: Self-pay | Admitting: Family Medicine

## 2018-01-12 ENCOUNTER — Ambulatory Visit: Payer: 59 | Admitting: Cardiovascular Disease

## 2018-01-12 ENCOUNTER — Encounter: Payer: Self-pay | Admitting: Cardiovascular Disease

## 2018-01-12 DIAGNOSIS — G4733 Obstructive sleep apnea (adult) (pediatric): Secondary | ICD-10-CM | POA: Diagnosis not present

## 2018-01-12 DIAGNOSIS — I7771 Dissection of carotid artery: Secondary | ICD-10-CM | POA: Diagnosis not present

## 2018-01-12 DIAGNOSIS — I10 Essential (primary) hypertension: Secondary | ICD-10-CM | POA: Diagnosis not present

## 2018-01-12 DIAGNOSIS — Z9989 Dependence on other enabling machines and devices: Secondary | ICD-10-CM | POA: Diagnosis not present

## 2018-01-12 NOTE — Patient Instructions (Addendum)
Medication Instructions:  Your physician recommends that you continue on your current medications as directed. Please refer to the Current Medication list given to you today.  If you need a refill on your cardiac medications before your next appointment, please call your pharmacy.   Lab work: NONE If you have labs (blood work) drawn today and your tests are completely normal, you will receive your results only by: Marland Kitchen. MyChart Message (if you have MyChart) OR . A paper copy in the mail If you have any lab test that is abnormal or we need to change your treatment, we will call you to review the results.  Testing/Procedures: Your physician has requested that you have a carotid duplex. This test is an ultrasound of the carotid arteries in your neck. It looks at blood flow through these arteries that supply the brain with blood. Allow one hour for this exam. There are no restrictions or special instructions.  SCHEDULE FOR NOVEMBER 2020   Follow-Up: At [facility], you and your health needs are our priority.  As part of our continuing mission to provide you with exceptional heart care, we have created designated Provider Care Teams.  These Care Teams include your primary Cardiologist (physician) and Advanced Practice Providers (APPs -  Physician Assistants and Nurse Practitioners) who all work together to provide you with the care you need, when you need it. You will need a follow up appointment in 12 months.  Please call our office 2 months in advance to schedule this appointment.  You may see DR. BERRY or one of the following Advanced Practice Providers on your designated Care Team:   Corine ShelterLuke Kilroy, PA-C [place] AFBKrista Kroeger, [place] JerseyPA-C . Marjie Skiffallie Goodrich, PA-C

## 2018-01-12 NOTE — Progress Notes (Signed)
01/12/2018 Mark PillionAnthony Gilmore   04/10/1965  [id number]  Primary Physician [name], Maryjean MornPhilip H, MD Primary Cardiologist: Runell GessJonathan J Jermaine Tholl MD Nicholes CalamityFACP, FACC, FAHA, [place]  HPI:  Mark Gilmore is a 52 y.o.  mild to moderately overweight married Caucasian male father of 2 young daughters referred by Dr. Marvel PlanMcGowan for cardiovascular evaluation because of a history of carotid artery dissection and subsequent stenting.  I last saw him in the office 02/02/2017.  He works as a Pharmacist, hospitalsystems engineering manager and now works for the [employer], [employer]. [place]. His only cardiac risk factor is treated hypertension. He does have obstructive sleep apnea on CPAP. Never had a heart attack. He did have a spontaneous left carotid dissection April 2016 was on Coumadin for a short period time. [place] then [place] [facility] [place] This followed noninvasively since then he has been asymptomatic with only mild right upper extremity residual.   Since I saw him a year ago he is remained stable.  Recent carotid Dopplers performed 01/03/2018 revealed his stent to be widely patent.  He denies chest pain or shortness of breath.  His lipid profile does reveal a mildly elevated LDL of 125.  He does admit to dietary indiscretion.  Current Meds  Medication Sig  . amLODipine (NORVASC) 5 MG tablet Take 1 tablet (5 mg total) by mouth daily.  Marland Kitchen. aspirin EC 81 MG tablet Take 81 mg 2 (two) times daily by mouth.  . hydrochlorothiazide (HYDRODIURIL) 25 MG tablet Take 1 tablet (25 mg total) by mouth daily.  Marland Kitchen. lisinopril (PRINIVIL,ZESTRIL) 40 MG tablet Take 1 tablet (40 mg total) by mouth daily.     No Known Allergies  Social History   Socioeconomic History  . Marital status: Married    Spouse name: Not on file  . Number of children: Not on file  . Years of education: Not on file  . Highest education level: Not on file  Occupational  History  . Not on file  Social Needs  . Financial resource strain: Not on file  . Food insecurity:    Worry: Not on file    Inability: Not on file  . Transportation needs:    Medical: Not on file    Non-medical: Not on file  Tobacco Use  . Smoking status: Former Smoker    Last attempt to quit: 11/17/1996    Years since quitting: 21.1  . Smokeless tobacco: Former Engineer, waterUser  Substance and Sexual Activity  . Alcohol use: No  . Drug use: No  . Sexual activity: Not on file  Lifestyle  . Physical activity:    Days per week: Not on file    Minutes per session: Not on file  . Stress: Not on file  Relationships  . Social connections:    Talks on phone: Not on file    Gets together: Not on file    Attends religious service: Not on file    Active member of club or organization: Not on file    Attends meetings of clubs or organizations: Not on file    Relationship status: Not on file  . Intimate partner violence:    Fear of current or ex partner: Not on file    Emotionally abused: Not on file    Physically abused: Not on file    Forced sexual activity: Not on file  Other Topics Concern  . Not on file  Social History Narrative   Married, 4 kids.   Moved from [place] to [facility] 08/2016.   Educ: masters degree   Occup: Corporate investment banker.   Tob: 5 pack yr hx, quit about 1998.   No alcohol or drugs.     Review of Systems: General: negative for chills, fever, night sweats or weight changes.  Cardiovascular: negative for chest pain, dyspnea on exertion, edema, orthopnea, palpitations, paroxysmal nocturnal dyspnea or shortness of breath Dermatological: negative for rash Respiratory: negative for cough or wheezing Urologic: negative for hematuria Abdominal: negative for nausea, vomiting, diarrhea, bright red blood per rectum, melena, or hematemesis Neurologic: negative for visual changes, syncope, or dizziness All other systems reviewed and are otherwise  negative except as noted above.    Blood pressure 118/80, pulse 70, height 5\' 7"  (1.702 m), weight 207 lb (93.9 kg).  General appearance: alert and no distress Neck: no adenopathy, no carotid bruit, no JVD, supple, symmetrical, trachea midline and thyroid not enlarged, symmetric, no tenderness/mass/nodules Lungs: clear to auscultation bilaterally Heart: regular rate and rhythm, S1, S2 normal, no murmur, click, rub or gallop Extremities: extremities normal, atraumatic, no cyanosis or edema Pulses: 2+ and symmetric Skin: Skin color, texture, turgor normal. No rashes or lesions Neurologic: Alert and oriented X 3, normal strength and tone. Normal symmetric reflexes. Normal coordination and gait  EKG sinus rhythm at 70 without ST or T wave changes.  I personally reviewed this EKG.  ASSESSMENT AND PLAN:   OSA on CPAP History of of obstructive sleep apnea on CPAP  Hypertension History of essential hypertension her blood pressure measured today at 118/80.  He is on lisinopril, amlodipine and hydrochlorothiazide.  Continue current meds at current dosing.  Carotid artery dissection ([facility]) History of spontaneous left carotid dissection April 2016 with stenting 3 months later at [facility].  He is gotten subsequent Doppler studies to follow this prospectively the most recent 1 performed 01/03/2018 revealing a stent to be widely patent.      Runell Gess MD FACP,FACC,FAHA, [facility] 01/12/2018 2:58 PM

## 2018-01-12 NOTE — Assessment & Plan Note (Signed)
History of spontaneous left carotid dissection April 2016 [facility] [place].  He is gotten subsequent Doppler studies to follow this prospectively the most recent 1 performed 01/03/2018 revealing a stent to be widely patent.

## 2018-01-12 NOTE — Assessment & Plan Note (Signed)
History of essential hypertension her blood pressure measured today at 118/80.  He is on lisinopril, amlodipine and hydrochlorothiazide.  Continue current meds at current dosing.

## 2018-01-12 NOTE — Assessment & Plan Note (Signed)
History of of obstructive sleep apnea on CPAP ?

## 2018-01-13 ENCOUNTER — Encounter: Payer: Self-pay | Admitting: Family Medicine

## 2018-01-14 ENCOUNTER — Encounter: Payer: Self-pay | Admitting: Family Medicine

## 2018-01-31 ENCOUNTER — Ambulatory Visit: Payer: Self-pay | Admitting: Family Medicine

## 2018-02-08 ENCOUNTER — Other Ambulatory Visit: Payer: Self-pay | Admitting: Family Medicine

## 2018-02-17 ENCOUNTER — Encounter: Payer: Self-pay | Admitting: Family Medicine

## 2018-02-17 ENCOUNTER — Ambulatory Visit (INDEPENDENT_AMBULATORY_CARE_PROVIDER_SITE_OTHER): Payer: 59 | Admitting: Family Medicine

## 2018-02-17 VITALS — BP 107/76 | HR 74 | Temp 98.6°F | Resp 16 | Ht 67.5 in | Wt 206.0 lb

## 2018-02-17 DIAGNOSIS — Z125 Encounter for screening for malignant neoplasm of prostate: Secondary | ICD-10-CM

## 2018-02-17 DIAGNOSIS — Z23 Encounter for immunization: Secondary | ICD-10-CM

## 2018-02-17 DIAGNOSIS — Z0000 Encounter for general adult medical examination without abnormal findings: Secondary | ICD-10-CM | POA: Diagnosis not present

## 2018-02-17 DIAGNOSIS — E669 Obesity, unspecified: Secondary | ICD-10-CM

## 2018-02-17 DIAGNOSIS — I10 Essential (primary) hypertension: Secondary | ICD-10-CM

## 2018-02-17 DIAGNOSIS — Z1211 Encounter for screening for malignant neoplasm of colon: Secondary | ICD-10-CM

## 2018-02-17 DIAGNOSIS — L918 Other hypertrophic disorders of the skin: Secondary | ICD-10-CM

## 2018-02-17 LAB — CBC WITH DIFFERENTIAL/PLATELET
BASOS ABS: 0.1 10*3/uL (ref 0.0–0.1)
Basophils Relative: 0.9 % (ref 0.0–3.0)
EOS ABS: 0.3 10*3/uL (ref 0.0–0.7)
Eosinophils Relative: 3.9 % (ref 0.0–5.0)
HEMATOCRIT: 48.6 % (ref 39.0–52.0)
HEMOGLOBIN: 16.9 g/dL (ref 13.0–17.0)
LYMPHS PCT: 36.3 % (ref 12.0–46.0)
Lymphs Abs: 2.4 10*3/uL (ref 0.7–4.0)
MCHC: 34.8 g/dL (ref 30.0–36.0)
MCV: 89.8 fl (ref 78.0–100.0)
MONOS PCT: 6.4 % (ref 3.0–12.0)
Monocytes Absolute: 0.4 10*3/uL (ref 0.1–1.0)
NEUTROS ABS: 3.4 10*3/uL (ref 1.4–7.7)
Neutrophils Relative %: 52.5 % (ref 43.0–77.0)
Platelets: 212 10*3/uL (ref 150.0–400.0)
RBC: 5.41 Mil/uL (ref 4.22–5.81)
RDW: 12.7 % (ref 11.5–15.5)
WBC: 6.5 10*3/uL (ref 4.0–10.5)

## 2018-02-17 LAB — LIPID PANEL
CHOL/HDL RATIO: 5
CHOLESTEROL: 198 mg/dL (ref 0–200)
HDL: 42.1 mg/dL (ref >39.00)
LDL CALC: 126 mg/dL — AB (ref 0–99)
NonHDL: 155.64
TRIGLYCERIDES: 149 mg/dL (ref 0.0–149.0)
VLDL: 29.8 mg/dL (ref 0.0–40.0)

## 2018-02-17 LAB — COMPREHENSIVE METABOLIC PANEL
ALBUMIN: 4.7 g/dL (ref 3.5–5.2)
ALT: 45 U/L (ref 0–53)
AST: 23 U/L (ref 0–37)
Alkaline Phosphatase: 48 U/L (ref 39–117)
BUN: 20 mg/dL (ref 6–23)
CHLORIDE: 105 meq/L (ref 96–112)
CO2: 25 meq/L (ref 19–32)
CREATININE: 0.97 mg/dL (ref 0.40–1.50)
Calcium: 9.8 mg/dL (ref 8.4–10.5)
GFR: 81.73 mL/min (ref >60.00)
Glucose, Bld: 101 mg/dL — ABNORMAL HIGH (ref 70–99)
POTASSIUM: 3.7 meq/L (ref 3.5–5.1)
Sodium: 140 mEq/L (ref 135–145)
Total Bilirubin: 1.4 mg/dL — ABNORMAL HIGH (ref 0.2–1.2)
Total Protein: 7 g/dL (ref 6.0–8.3)

## 2018-02-17 LAB — TSH: TSH: 1.22 u[IU]/mL (ref 0.35–4.50)

## 2018-02-17 LAB — PSA: PSA: 0.89 ng/mL (ref 0.10–4.00)

## 2018-02-17 NOTE — Addendum Note (Signed)
Addended by: Smitty Knudsen on: 02/17/2018 08:50 AM   Modules accepted: Orders

## 2018-02-17 NOTE — Progress Notes (Signed)
Office Note 02/17/2018  CC:  Chief Complaint  Patient presents with  . Annual Exam    Pt is fasting.     HPI:  Mark Gilmore is a 53 y.o. White male who is here for annual health maintenance exam.  Exercise and diet are good. Dental and eye visits UTD.  Has a few skin tags in L axillary area that rub on clothes and sometimes hurt-->wants them removed.  Past Medical History:  Diagnosis Date  . Carotid artery dissection (HCC)    left; spontaneous  . History of pneumonia   . History of shingles   . Hypertension   . Obesity   . OSA on CPAP    Dx'd by home sleep study 04/2014.  On auto PAP since that time; most recent compliance monitoring 07/20/14 [facility], [place], [place]).  Compliance monitoring 12/2017 by Dr. Frances Furbish with [facility].  . Stroke (HCC) 04/2014   difficulty with word search, tightness in R arm.  Some residual spasticity in R arm.  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    Past Surgical History:  Procedure Laterality Date  . CAROTID STENT Left 08/23/2014   L carotid stent widely patent on carotid u/s by Dr. Allyson Sabal 02/09/16--repeat 1 yr same 12/2017.  . CHOLECYSTECTOMY  2006?  . ELECTROCARDIOGRAM  04/22/2014   [facility]  . HOME SLEEP STUDY  04/2014   Home sleep study: mild OSA  . VASECTOMY    . WISDOM TOOTH EXTRACTION      Family History  Problem Relation Age of Onset  . Hypertension Mother   . Diabetes Mother   . Stroke Mother 65       TIA's  . Dementia Mother   . Atrial fibrillation Mother        stent placement  . Alzheimer's disease Mother   . Hypertension Father   . Diabetes Father   . Hypertension Sister   . Diabetes Sister   . Thyroid disease Sister   . Kidney disease Maternal Grandmother        Dialysis  . CVA Maternal Grandmother   . Thyroid disease Maternal Grandmother   . Congestive Heart Failure Maternal Grandfather   . CVA  Maternal Grandfather   . Deep vein thrombosis Paternal Grandmother   . Asthma Daughter   . Breast cancer Maternal Aunt 35    Social History   Socioeconomic History  . Marital status: Married    Spouse name: Not on file  . Number of children: Not on file  . Years of education: Not on file  . Highest education level: Not on file  Occupational History  . Not on file  Social Needs  . Financial resource strain: Not on file  . Food insecurity:    Worry: Not on file    Inability: Not on file  . Transportation needs:    Medical: Not on file    Non-medical: Not on file  Tobacco Use  . Smoking status: Former Smoker    Last attempt to quit: 11/17/1996    Years since quitting: 21.2  . Smokeless tobacco: Former Engineer, water and Sexual Activity  . Alcohol use: No  . Drug use: No  . Sexual activity: Not on file  Lifestyle  . Physical activity:    Days per week: Not on file    Minutes per session: Not on file  . Stress: Not on file  Relationships  . Social  connections:    Talks on phone: Not on file    Gets together: Not on file    Attends religious service: Not on file    Active member of club or organization: Not on file    Attends meetings of clubs or organizations: Not on file    Relationship status: Not on file  . Intimate partner violence:    Fear of current or ex partner: Not on file    Emotionally abused: Not on file    Physically abused: Not on file    Forced sexual activity: Not on file  Other Topics Concern  . Not on file  Social History Narrative   Married, 4 kids.   Moved from [place] to [facility] 08/2016.   Educ: masters degree   Occup: Corporate investment banker.   Tob: 5 pack yr hx, quit about 1998.   No alcohol or drugs.    Outpatient Medications Prior to Visit  Medication Sig Dispense Refill  . amLODipine (NORVASC) 5 MG tablet Take 1 tablet (5 mg total) by mouth daily. 90 tablet 3  . aspirin EC 81 MG tablet Take 81 mg 2 (two) times  daily by mouth.    . hydrochlorothiazide (HYDRODIURIL) 25 MG tablet Take 1 tablet (25 mg total) by mouth daily. 90 tablet 3  . lisinopril (PRINIVIL,ZESTRIL) 40 MG tablet Take 1 tablet (40 mg total) by mouth daily. 90 tablet 3   No facility-administered medications prior to visit.     No Known Allergies  ROS Review of Systems  Constitutional: Negative for appetite change, chills, fatigue and fever.  HENT: Negative for congestion, dental problem, ear pain and sore throat.   Eyes: Negative for discharge, redness and visual disturbance.  Respiratory: Negative for cough, chest tightness, shortness of breath and wheezing.   Cardiovascular: Negative for chest pain, palpitations and leg swelling.  Gastrointestinal: Negative for abdominal pain, blood in stool, diarrhea, nausea and vomiting.  Genitourinary: Negative for difficulty urinating, dysuria, flank pain, frequency, hematuria and urgency.  Musculoskeletal: Negative for arthralgias, back pain, joint swelling, myalgias and neck stiffness.  Skin: Negative for pallor and rash.  Neurological: Negative for dizziness, speech difficulty, weakness and headaches.  Hematological: Negative for adenopathy. Does not bruise/bleed easily.  Psychiatric/Behavioral: Negative for confusion and sleep disturbance. The patient is not nervous/anxious.     PE; Blood pressure 107/76, pulse 74, temperature 98.6 F (37 C), temperature source Oral, resp. rate 16, height 5' 7.5" (1.715 m), weight 206 lb (93.4 kg), SpO2 94 %. Body mass index is 31.79 kg/m.  Gen: Alert, well appearing.  Patient is oriented to person, place, time, and situation. AFFECT: pleasant, lucid thought and speech. ENT: Ears: EACs clear, normal epithelium.  TMs with good light reflex and landmarks bilaterally.  Eyes: no injection, icteris, swelling, or exudate.  EOMI, PERRLA. Nose: no drainage or turbinate edema/swelling.  No injection or focal lesion.  Mouth: lips without lesion/swelling.  Oral  mucosa pink and moist.  Dentition intact and without obvious caries or gingival swelling.  Oropharynx without erythema, exudate, or swelling.  Neck: supple/nontender.  No LAD, mass, or TM.  Carotid pulses 2+ bilaterally, without bruits. CV: RRR, no m/r/g.   LUNGS: CTA bilat, nonlabored resps, good aeration in all lung fields. ABD: soft, NT, ND, BS normal.  No hepatospenomegaly or mass.  No bruits. EXT: no clubbing, cyanosis, or edema.  Musculoskeletal: no joint swelling, erythema, warmth, or tenderness.  ROM of all joints intact. Skin - no sores  or suspicious lesions or rashes or color changes He has 3 small skin tags in L axilla that he wants excised today.  These tags do not appear irritated. Rectal exam: negative without mass, lesions or tenderness, PROSTATE EXAM: smooth and symmetric without nodules or tenderness.   Pertinent labs:  Lab Results  Component Value Date   TSH 1.50 02/04/2017   Lab Results  Component Value Date   WBC 6.8 02/04/2017   HGB 17.2 (H) 02/04/2017   HCT 49.5 02/04/2017   MCV 90.9 02/04/2017   PLT 225.0 02/04/2017   Lab Results  Component Value Date   CREATININE 0.96 08/13/2017   BUN 20 08/13/2017   NA 141 08/13/2017   K 3.7 08/13/2017   CL 105 08/13/2017   CO2 28 08/13/2017   Lab Results  Component Value Date   ALT 36 02/04/2017   AST 18 02/04/2017   ALKPHOS 47 02/04/2017   BILITOT 1.8 (H) 02/04/2017   Lab Results  Component Value Date   CHOL 192 02/04/2017   Lab Results  Component Value Date   HDL 45.50 02/04/2017   Lab Results  Component Value Date   LDLCALC 125 (H) 02/04/2017   Lab Results  Component Value Date   TRIG 107.0 02/04/2017   Lab Results  Component Value Date   CHOLHDL 4 02/04/2017     ASSESSMENT AND PLAN:   Health maintenance exam: Reviewed age and gender appropriate health maintenance issues (prudent diet, regular exercise, health risks of tobacco and excessive alcohol, use of seatbelts, fire alarms in home,  use of sunscreen).  Also reviewed age and gender appropriate health screening as well as vaccine recommendations. Vaccines: all UTD.  Shingrix discussed-->#1 here today. Labs: fasting HP + PSA. Prostate ca screening: DRE normal , PSA. Colon ca screening: due for initial colon cancer screening-->cologuard per pt preference.  Used scalpal (no anesthetic required) to excise his 3 L axillary skin tags at the base.  Pt tolerated procedure well, no immediate complications.  An After Visit Summary was printed and given to the patient.  FOLLOW UP:  Return in about 6 months (around 08/18/2018) for routine chronic illness f/u.  Signed:  Santiago BumpersPhil Jensen Cheramie, MD           02/17/2018

## 2018-02-17 NOTE — Addendum Note (Signed)
Addended by: Daryel November L on: 02/17/2018 09:09 AM   Modules accepted: Orders

## 2018-02-17 NOTE — Patient Instructions (Signed)

## 2018-02-24 ENCOUNTER — Other Ambulatory Visit: Payer: Self-pay | Admitting: Family Medicine

## 2018-03-03 ENCOUNTER — Other Ambulatory Visit: Payer: Self-pay | Admitting: Family Medicine

## 2018-06-05 IMAGING — DX DG CHEST 2V
2 series · 2 of 2 positions shown · non-contrast
Comparison: None

CLINICAL DATA: Productive cough for 1 month, shortness of breath,
history bronchitis, pneumonia, former smoker, hypertension

EXAM:
CHEST  2 VIEW

[chest pa]
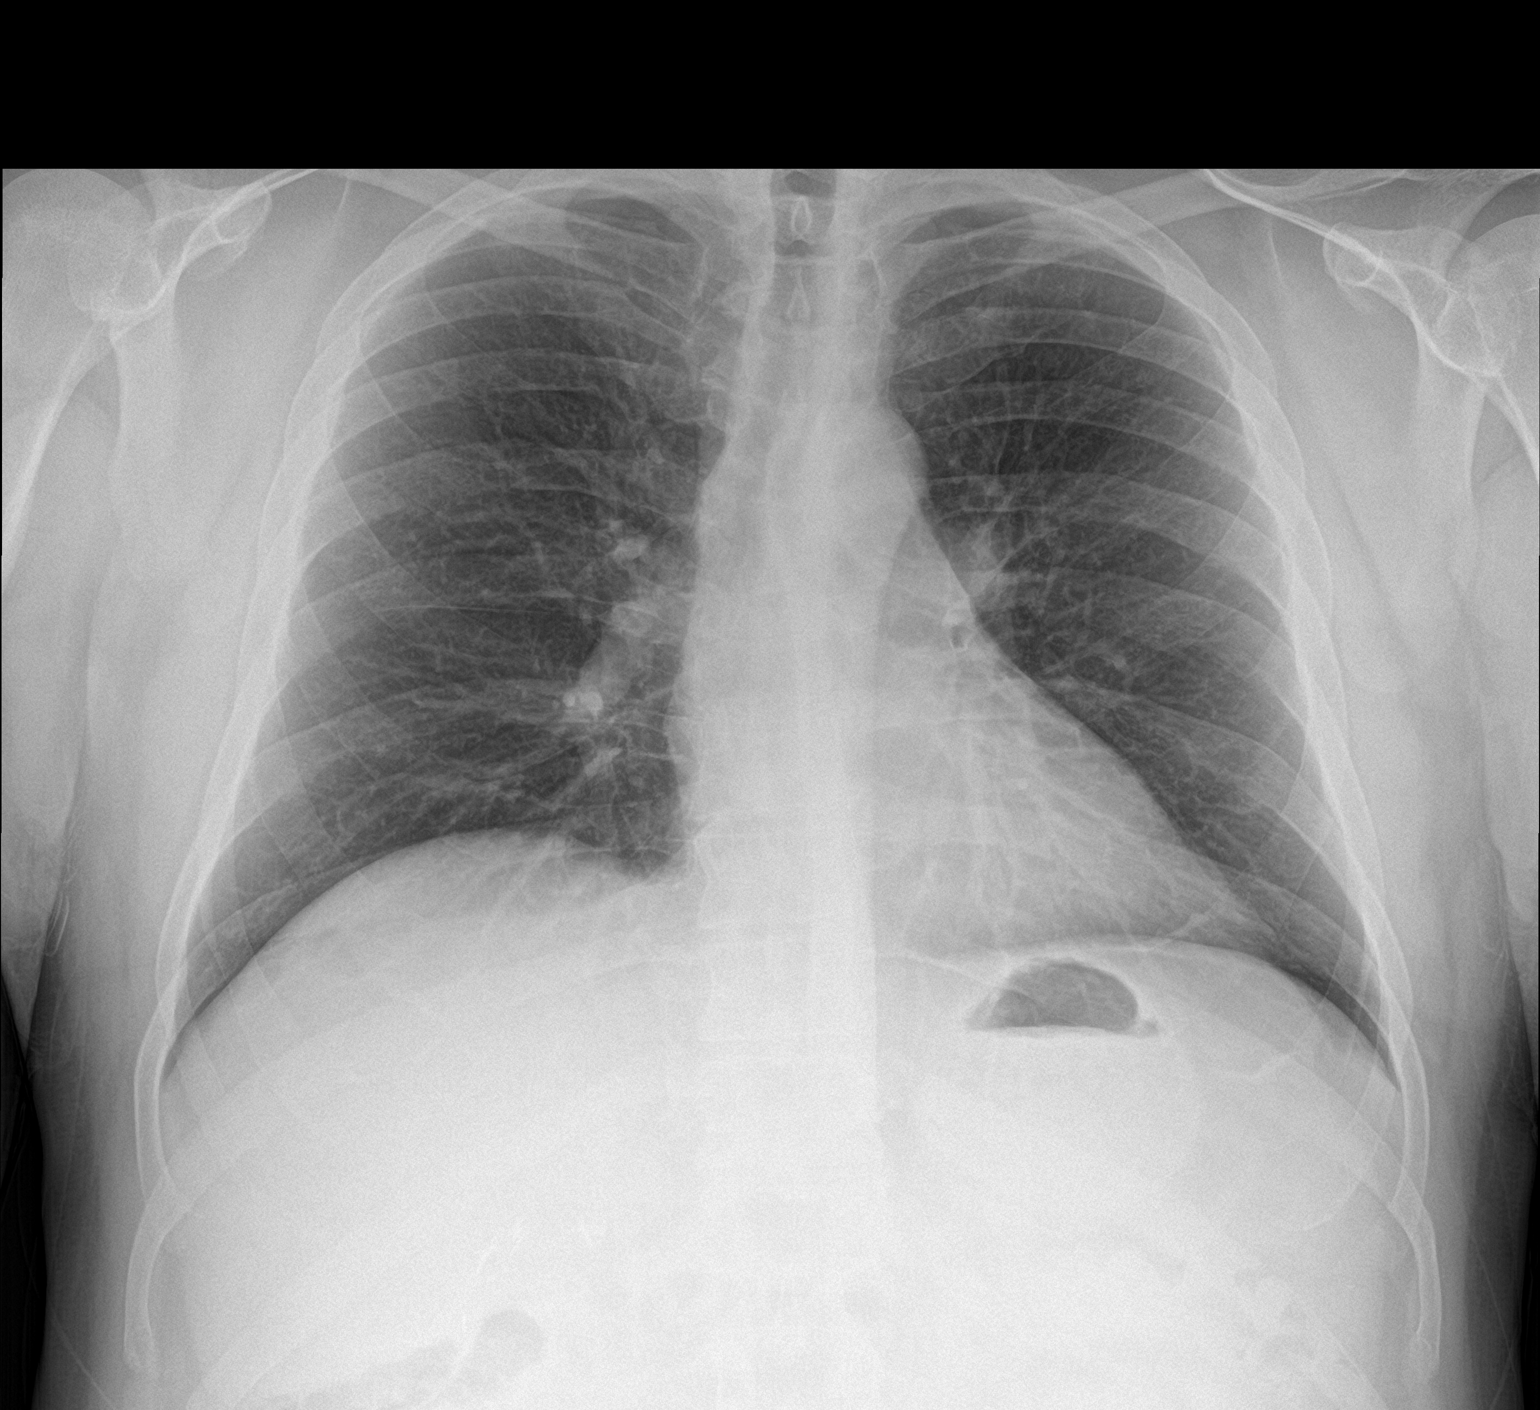

[chest lat]
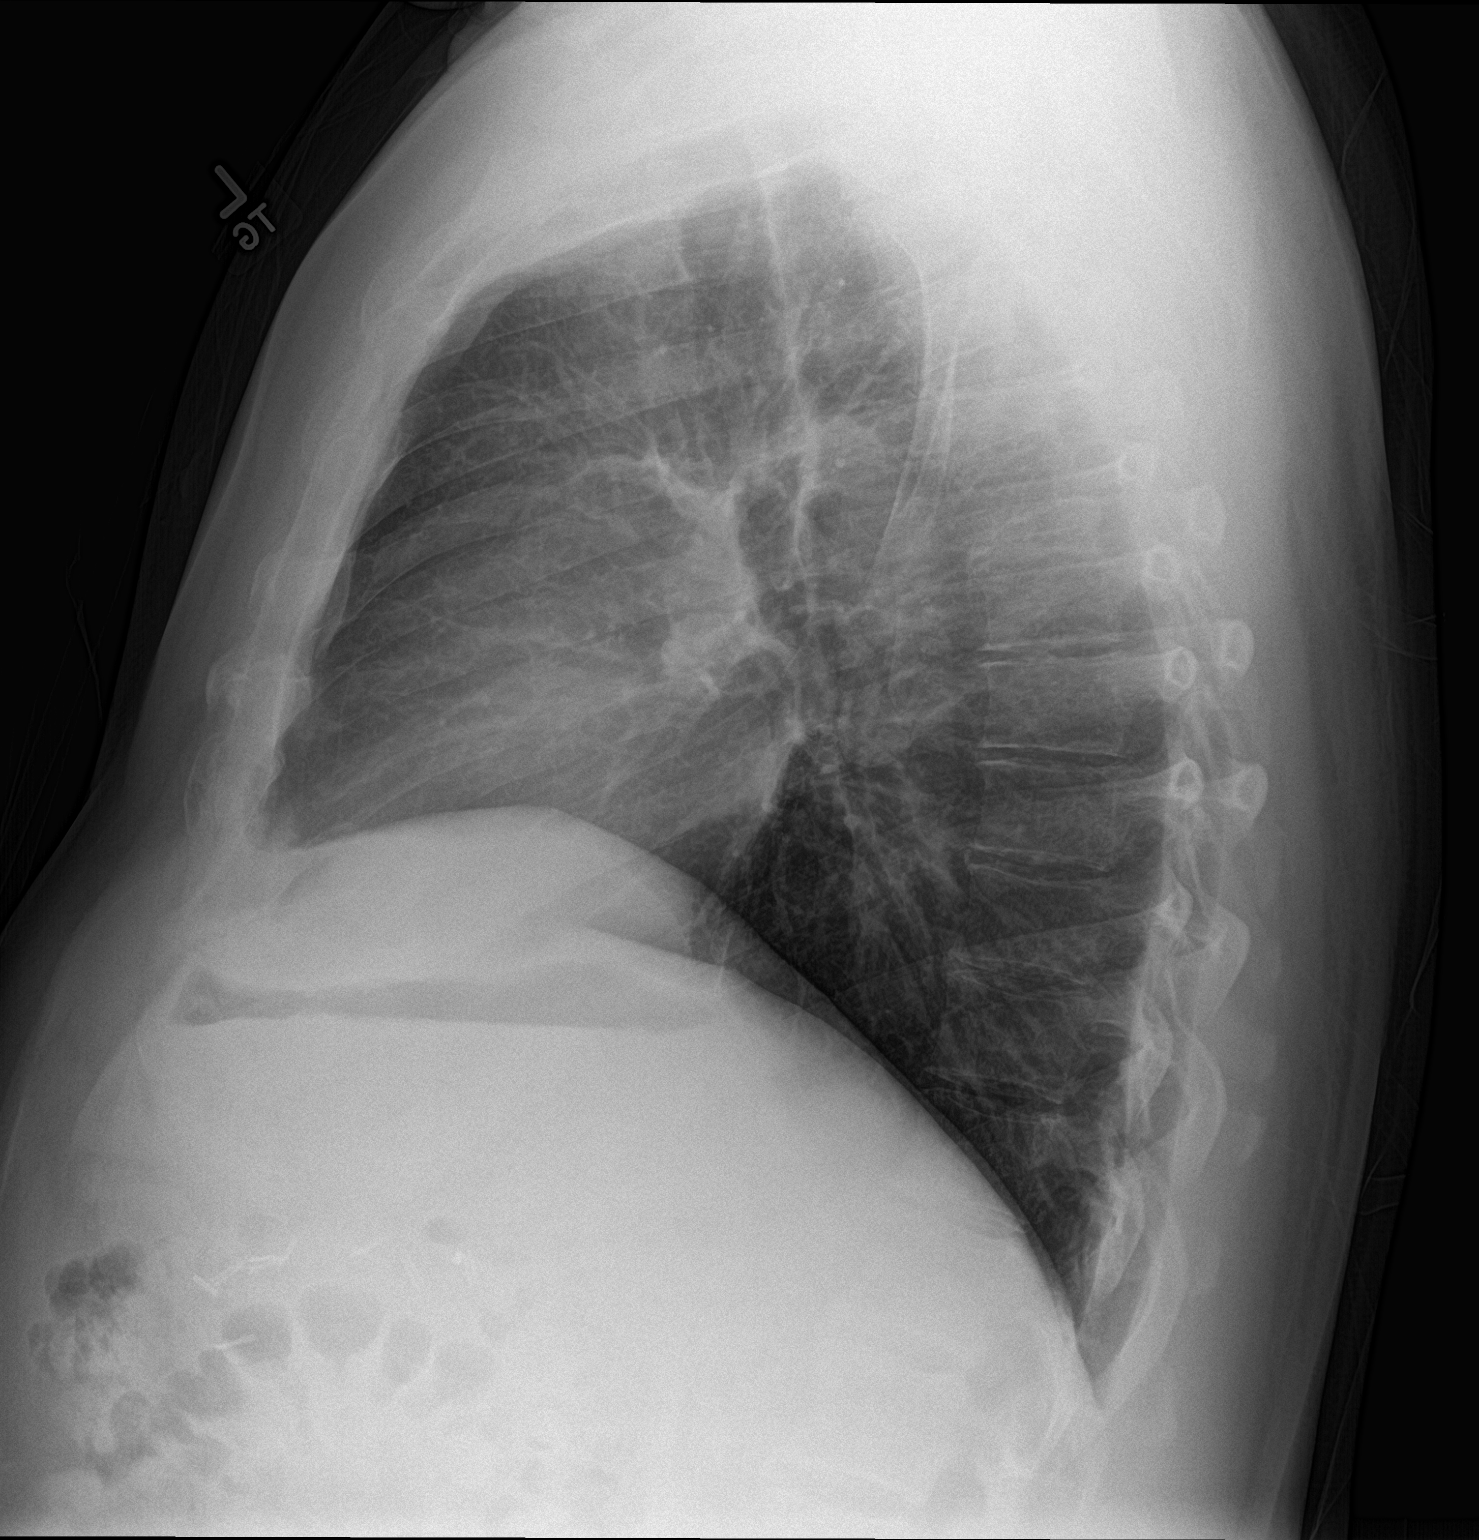

[2 of 2 positions shown; findings below may reference images not displayed]

FINDINGS: Normal heart size, mediastinal contours, and pulmonary vascularity.

Lungs clear.

No pleural effusion or pneumothorax.

Bones unremarkable.
IMPRESSION: Normal exam.

## 2018-08-17 ENCOUNTER — Other Ambulatory Visit: Payer: Self-pay

## 2018-08-17 ENCOUNTER — Ambulatory Visit: Payer: 59 | Admitting: Family Medicine

## 2018-08-17 ENCOUNTER — Encounter: Payer: Self-pay | Admitting: Family Medicine

## 2018-08-17 VITALS — BP 108/77 | HR 73 | Temp 97.4°F | Resp 16 | Ht 67.5 in | Wt 203.4 lb

## 2018-08-17 DIAGNOSIS — I10 Essential (primary) hypertension: Secondary | ICD-10-CM | POA: Diagnosis not present

## 2018-08-17 DIAGNOSIS — Z23 Encounter for immunization: Secondary | ICD-10-CM

## 2018-08-17 LAB — BASIC METABOLIC PANEL
BUN: 21 mg/dL (ref 6–23)
CO2: 24 mEq/L (ref 19–32)
Calcium: 9.7 mg/dL (ref 8.4–10.5)
Chloride: 106 mEq/L (ref 96–112)
Creatinine, Ser: 0.85 mg/dL (ref 0.40–1.50)
GFR: 95 mL/min (ref >60.00)
Glucose, Bld: 108 mg/dL — ABNORMAL HIGH (ref 70–99)
Potassium: 3.7 mEq/L (ref 3.5–5.1)
Sodium: 140 mEq/L (ref 135–145)

## 2018-08-17 NOTE — Progress Notes (Signed)
OFFICE VISIT  08/17/2018   CC:  Chief Complaint  Patient presents with  . Follow-up    RCI, pt is fasting   HPI:    Patient is a 53 y.o. Caucasian male who presents for 6 mo f/u HTN.  No home bp monitoring.  Compliant with medications. Exercising fine.  Diet fine.  No problems at this time.  Past Medical History:  Diagnosis Date  . Carotid artery dissection (HCC)    left; spontaneous  . History of pneumonia   . History of shingles   . Hypertension   . Obesity   . OSA on CPAP    Dx'd by home sleep study 04/2014.  On auto PAP since that time; most recent compliance monitoring 07/20/14 [facility] [place]  Compliance monitoring 12/2017 by Dr. Frances FurbishAthar with [facility].  . Stroke (HCC) 04/2014   difficulty with word search, tightness in R arm.  Some residual spasticity in R arm.  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    Past Surgical History:  Procedure Laterality Date  . CAROTID STENT Left 08/23/2014   L carotid stent widely patent on carotid u/s by Dr. Allyson SabalBerry 02/09/16--repeat 1 yr same 12/2017.  . CHOLECYSTECTOMY  2006?  . ELECTROCARDIOGRAM  04/22/2014   [facility] [place]  . HOME SLEEP STUDY  04/2014   Home sleep study: mild OSA  . VASECTOMY    . WISDOM TOOTH EXTRACTION      Outpatient Medications Prior to Visit  Medication Sig Dispense Refill  . amLODipine (NORVASC) 5 MG tablet TAKE 1 TABLET BY MOUTH  DAILY 90 tablet 3  . aspirin EC 81 MG tablet Take 81 mg 2 (two) times daily by mouth.    . hydrochlorothiazide (HYDRODIURIL) 25 MG tablet TAKE 1 TABLET BY MOUTH  DAILY 90 tablet 3  . lisinopril (PRINIVIL,ZESTRIL) 40 MG tablet TAKE 1 TABLET BY MOUTH  DAILY 90 tablet 3   No facility-administered medications prior to visit.     No Known Allergies  ROS As per HPI  PE: Blood pressure 108/77, pulse 73, temperature (!) 97.4 F (36.3 C), temperature source  Temporal, resp. rate 16, height 5' 7.5" (1.715 m), weight 203 lb 6.4 oz (92.3 kg), SpO2 95 %. Gen: Alert, well appearing.  Patient is oriented to person, place, time, and situation. AFFECT: pleasant, lucid thought and speech. Neck - No masses or thyromegaly or limitation in range of motion.  Carotids 1+ bilat, no bruits. CV: RRR, no m/r/g.   LUNGS: CTA bilat, nonlabored resps, good aeration in all lung fields. EXT: no clubbing or cyanosis.  no edema.     LABS:  Lab Results  Component Value Date   TSH 1.22 02/17/2018   Lab Results  Component Value Date   WBC 6.5 02/17/2018   HGB 16.9 02/17/2018   HCT 48.6 02/17/2018   MCV 89.8 02/17/2018   PLT 212.0 02/17/2018   Lab Results  Component Value Date   CREATININE 0.97 02/17/2018   BUN 20 02/17/2018   NA 140 02/17/2018   K 3.7 02/17/2018   CL 105 02/17/2018   CO2 25 02/17/2018   Lab Results  Component Value Date   ALT 45 02/17/2018   AST 23 02/17/2018   ALKPHOS 48 02/17/2018   BILITOT 1.4 (H) 02/17/2018   Lab Results  Component Value Date   CHOL 198 02/17/2018   Lab Results  Component Value Date   HDL 42.10 02/17/2018  Lab Results  Component Value Date   LDLCALC 126 (H) 02/17/2018   Lab Results  Component Value Date   TRIG 149.0 02/17/2018   Lab Results  Component Value Date   CHOLHDL 5 02/17/2018   Lab Results  Component Value Date   PSA 0.89 02/17/2018    IMPRESSION AND PLAN:  HTN: The current medical regimen is effective;  continue present plan and medications. BMET today. Shingrix #2 today.  An After Visit Summary was printed and given to the patient.  FOLLOW UP: Return in about 6 months (around 02/17/2019) for annual CPE (fasting).  Signed:  Crissie Sickles, MD           08/17/2018

## 2018-08-17 NOTE — Addendum Note (Signed)
Addended by: Deveron Furlong D on: 08/17/2018 08:49 AM   Modules accepted: Orders

## 2018-08-18 ENCOUNTER — Telehealth: Payer: Self-pay | Admitting: Family Medicine

## 2018-08-18 ENCOUNTER — Encounter: Payer: Self-pay | Admitting: Family Medicine

## 2018-08-18 NOTE — Telephone Encounter (Signed)
No need to return for A1c.

## 2018-08-18 NOTE — Telephone Encounter (Signed)
Shiquita w/ [facility] called to say that they are not able to add an A1c as requested b/c they only received a chem tube (gold) and no blood was in a purple tube. Do you want pt to come back to obtain an A1c?

## 2018-11-04 ENCOUNTER — Encounter: Payer: Self-pay | Admitting: Family Medicine

## 2018-11-07 ENCOUNTER — Other Ambulatory Visit: Payer: Self-pay

## 2018-11-07 MED ORDER — LISINOPRIL 40 MG PO TABS: 40.0000 mg | ORAL_TABLET | Freq: Every day | ORAL | 0 refills | Status: DC

## 2018-11-07 MED ORDER — AMLODIPINE BESYLATE 5 MG PO TABS: 5.0000 mg | ORAL_TABLET | Freq: Every day | ORAL | 0 refills | Status: DC

## 2018-11-07 MED ORDER — HYDROCHLOROTHIAZIDE 25 MG PO TABS: 25.0000 mg | ORAL_TABLET | Freq: Every day | ORAL | 0 refills | Status: DC

## 2018-12-14 ENCOUNTER — Other Ambulatory Visit: Payer: Self-pay

## 2018-12-14 ENCOUNTER — Ambulatory Visit (HOSPITAL_COMMUNITY)
Admission: RE | Admit: 2018-12-14 | Discharge: 2018-12-14 | Disposition: A | Discharge status: Home / Self Care | Payer: 59 | Source: Ambulatory Visit | Attending: Cardiology | Admitting: Cardiology

## 2018-12-14 DIAGNOSIS — I7771 Dissection of carotid artery: Secondary | ICD-10-CM | POA: Diagnosis not present

## 2018-12-16 ENCOUNTER — Other Ambulatory Visit: Payer: Self-pay | Admitting: *Deleted

## 2018-12-16 ENCOUNTER — Encounter: Payer: Self-pay | Admitting: *Deleted

## 2018-12-16 DIAGNOSIS — I7771 Dissection of carotid artery: Secondary | ICD-10-CM

## 2018-12-29 ENCOUNTER — Other Ambulatory Visit: Payer: Self-pay

## 2018-12-29 ENCOUNTER — Ambulatory Visit: Payer: 59 | Admitting: Adult Health

## 2018-12-29 ENCOUNTER — Encounter: Payer: Self-pay | Admitting: Adult Health

## 2018-12-29 VITALS — BP 109/75 | HR 66 | Temp 96.8°F | Ht 67.5 in | Wt 211.4 lb

## 2018-12-29 DIAGNOSIS — Z9989 Dependence on other enabling machines and devices: Secondary | ICD-10-CM | POA: Diagnosis not present

## 2018-12-29 DIAGNOSIS — G4733 Obstructive sleep apnea (adult) (pediatric): Secondary | ICD-10-CM | POA: Diagnosis not present

## 2018-12-29 NOTE — Patient Instructions (Signed)
Continue using CPAP nightly and greater than 4 hours each night °If your symptoms worsen or you develop new symptoms please let us know.  ° °

## 2018-12-29 NOTE — Progress Notes (Addendum)
PATIENT: Mark Gilmore DOB: 27-Jan-1966  REASON FOR VISIT: follow up HISTORY FROM: patient  HISTORY OF PRESENT ILLNESS: Today 12/29/18:  Mr. Mark Gilmore is a 53 year old male with a history of obstructive sleep apnea on CPAP.  He returns today for follow-up.  His download indicates that he use the machine 18 out of 30 days for compliance of 60%.  He uses machine greater than 4 hours each night.  On average he uses his machine 6 hours and 50 minutes.  His residual AHI is 1.6 on 6 to 15 cm of water with EPR of 1.  His leak in the 95th percentile is 15.5 L/min.  He reports that the CPAP continues to work well for him.  He denies any new issues.  HISTORY 12/28/2017: I reviewed his CPAP compliance data from 11/26/2017 through 12/25/2017 which is a total of 30 days, during which time he used his AutoPap 18 days with percent used days greater than 4 hours at 60%, indicating slightly suboptimal compliance with an average usage of 6 hours and 32 minutes, residual AHI at goal at 2 per hour, leak acceptable with the 95th percentile at 6.6 L/m, 95th percentile pressure at 9.9 cm with a range of 6 cm to 15 cm. He reports that AutoPap is going well. He benefits from treatment, he tends not to take it with him when he travels but other than that he is compliant with treatment and motivated to continue with it. He saw cardiology earlier this year and is supposed to have a one-year checkup coming up in January 2020 with a carotid Doppler ultrasound also scheduled. Neurologically he has been stable, he tries to hydrate well with water and takes a baby aspirin, sometimes 2 a day.  REVIEW OF SYSTEMS: Out of a complete 14 system review of symptoms, the patient complains only of the following symptoms, and all other reviewed systems are negative.  ESS: 5 F SS 25 ALLERGIES: No Known Allergies  HOME MEDICATIONS: Outpatient Medications Prior to Visit  Medication Sig Dispense Refill  . amLODipine (NORVASC) 5 MG tablet  Take 1 tablet (5 mg total) by mouth daily. 90 tablet 0  . aspirin EC 81 MG tablet Take 81 mg 2 (two) times daily by mouth.    . hydrochlorothiazide (HYDRODIURIL) 25 MG tablet Take 1 tablet (25 mg total) by mouth daily. 90 tablet 0  . lisinopril (ZESTRIL) 40 MG tablet Take 1 tablet (40 mg total) by mouth daily. 90 tablet 0   No facility-administered medications prior to visit.     PAST MEDICAL HISTORY: Past Medical History:  Diagnosis Date  . Carotid artery dissection (HCC)    left; spontaneous  . History of pneumonia   . History of shingles   . Hypertension   . IFG (impaired fasting glucose)    108 07/2018 f/u visit.  Check A1c with NEXT f/u labs.  . Obesity   . OSA on CPAP    Dx'd by home sleep study 04/2014.  On auto PAP since that time; most recent compliance monitoring 07/20/14 [facility] [place]  Compliance monitoring 12/2017 by Dr. Frances FurbishAthar with [facility].  . Stroke (HCC) 04/2014   difficulty with word search, tightness in R arm.  Some residual spasticity in R arm.  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    PAST SURGICAL HISTORY: Past Surgical History:  Procedure Laterality Date  . CAROTID STENT Left 08/23/2014   L carotid stent  widely patent on carotid u/s by Dr. Gwenlyn Found 02/09/16--repeat 1 yr same 12/2017.  . CHOLECYSTECTOMY  2006?  . ELECTROCARDIOGRAM  04/22/2014   [place] STUDY  04/2014   Home sleep study: mild OSA  . VASECTOMY    . WISDOM TOOTH EXTRACTION      FAMILY HISTORY: Family History  Problem Relation Age of Onset  . Hypertension Mother   . Diabetes Mother   . Stroke Mother 20       TIA's  . Dementia Mother   . Atrial fibrillation Mother        stent placement  . Alzheimer's disease Mother   . Hypertension Father   . Diabetes Father   . Hypertension Sister   . Diabetes Sister   . Thyroid disease Sister   . Kidney  disease Maternal Grandmother        Dialysis  . CVA Maternal Grandmother   . Thyroid disease Maternal Grandmother   . Congestive Heart Failure Maternal Grandfather   . CVA Maternal Grandfather   . Deep vein thrombosis Paternal Grandmother   . Asthma Daughter   . Breast cancer Maternal Aunt 58    SOCIAL HISTORY: Social History   Socioeconomic History  . Marital status: Married    Spouse name: Not on file  . Number of children: Not on file  . Years of education: Not on file  . Highest education level: Not on file  Occupational History  . Not on file  Social Needs  . Financial resource strain: Not on file  . Food insecurity    Worry: Not on file    Inability: Not on file  . Transportation needs    Medical: Not on file    Non-medical: Not on file  Tobacco Use  . Smoking status: Former Smoker    Quit date: 11/17/1996    Years since quitting: 22.1  . Smokeless tobacco: Former Network engineer and Sexual Activity  . Alcohol use: No  . Drug use: No  . Sexual activity: Not on file  Lifestyle  . Physical activity    Days per week: Not on file    Minutes per session: Not on file  . Stress: Not on file  Relationships  . Social Herbalist on phone: Not on file    Gets together: Not on file    Attends religious service: Not on file    Active member of club or organization: Not on file    Attends meetings of clubs or organizations: Not on file    Relationship status: Not on file  . Intimate partner violence    Fear of current or ex partner: Not on file    Emotionally abused: Not on file    Physically abused: Not on file    Forced sexual activity: Not on file  Other Topics Concern  . Not on file  Social History Narrative   Married, 4 kids.   Moved from [place] to [facility] 08/2016.   Educ: masters degree   Occup: Psychologist, occupational.   Tob: 5 pack yr hx, quit about 1998.   No alcohol or drugs.      PHYSICAL EXAM  Vitals:    12/29/18 0746  BP: 109/75  Pulse: 66  Temp: (!) 96.8 F (36 C)  TempSrc: Oral  Weight: 211 lb 6.4 oz (95.9 kg)  Height: 5' 7.5" (1.715 m)   Body mass index is 32.62  kg/m.  Generalized: Well developed, in no acute distress  Chest: Lungs clear to auscultation bilaterally  Neurological examination  Mentation: Alert oriented to time, place, history taking. Follows all commands speech and language fluent Cranial nerve II-XII: Extraocular movements were full, visual field were full on confrontational test Head turning and shoulder shrug  were normal and symmetric. Motor: The motor testing reveals 5 over 5 strength of all 4 extremities. Good symmetric motor tone is noted throughout.  Sensory: Sensory testing is intact to soft touch on all 4 extremities. No evidence of extinction is noted.  Gait and station: Gait is normal.    DIAGNOSTIC DATA (LABS, IMAGING, TESTING) - I reviewed patient records, labs, notes, testing and imaging myself where available.  Lab Results  Component Value Date   WBC 6.5 02/17/2018   HGB 16.9 02/17/2018   HCT 48.6 02/17/2018   MCV 89.8 02/17/2018   PLT 212.0 02/17/2018      Component Value Date/Time   NA 140 08/17/2018 0822   K 3.7 08/17/2018 0822   CL 106 08/17/2018 0822   CO2 24 08/17/2018 0822   GLUCOSE 108 (H) 08/17/2018 0822   BUN 21 08/17/2018 0822   CREATININE 0.85 08/17/2018 0822   CALCIUM 9.7 08/17/2018 0822   PROT 7.0 02/17/2018 0856   ALBUMIN 4.7 02/17/2018 0856   AST 23 02/17/2018 0856   ALT 45 02/17/2018 0856   ALKPHOS 48 02/17/2018 0856   BILITOT 1.4 (H) 02/17/2018 0856   Lab Results  Component Value Date   CHOL 198 02/17/2018   HDL 42.10 02/17/2018   LDLCALC 126 (H) 02/17/2018   TRIG 149.0 02/17/2018   CHOLHDL 5 02/17/2018    Lab Results  Component Value Date   TSH 1.22 02/17/2018      ASSESSMENT AND PLAN 53 y.o. year old male  has a past medical history of Carotid artery dissection (HCC), History of pneumonia,  History of shingles, Hypertension, IFG (impaired fasting glucose), Obesity, OSA on CPAP, and Stroke (HCC) (04/2014). here with:  1. Obstructive sleep apnea on CPAP  The patient's CPAP download shows excellent compliance and good treatment of his apnea.  He is encouraged to continue using CPAP nightly and greater than 4 hours each night.  He is advised that if his symptoms worsen or he develops new symptoms he should let us know.  He will follow-up in 1 year or sooner if needed   I spent 15 minutes with the patient. 50% of this time was spent reviewing CPAP download   Butch Penny, MSN, NP-C 12/29/2018, 9:06 AM [facility] [address] [place], [place] [address] [phone]  I reviewed the above note and documentation by the Nurse Practitioner and agree with the history, exam, assessment and plan as outlined above. I was available for consultation. Huston Foley, MD, PhD [name])

## 2019-01-15 ENCOUNTER — Encounter: Payer: Self-pay | Admitting: Family Medicine

## 2019-01-17 ENCOUNTER — Ambulatory Visit: Payer: 59 | Attending: Internal Medicine

## 2019-01-17 ENCOUNTER — Other Ambulatory Visit: Payer: 59

## 2019-01-17 DIAGNOSIS — Z20822 Contact with and (suspected) exposure to covid-19: Secondary | ICD-10-CM

## 2019-01-19 LAB — NOVEL CORONAVIRUS, NAA: SARS-CoV-2, NAA: NOT DETECTED

## 2019-01-27 DIAGNOSIS — Z1211 Encounter for screening for malignant neoplasm of colon: Secondary | ICD-10-CM

## 2019-01-27 HISTORY — DX: Encounter for screening for malignant neoplasm of colon: Z12.11

## 2019-01-30 ENCOUNTER — Other Ambulatory Visit: Payer: Self-pay | Admitting: Family Medicine

## 2019-02-15 ENCOUNTER — Encounter: Payer: Self-pay | Admitting: Family Medicine

## 2019-02-15 ENCOUNTER — Other Ambulatory Visit: Payer: Self-pay

## 2019-02-15 DIAGNOSIS — Z1211 Encounter for screening for malignant neoplasm of colon: Secondary | ICD-10-CM

## 2019-02-15 LAB — COLOGUARD: Cologuard: NEGATIVE

## 2019-02-15 NOTE — Addendum Note (Signed)
Addended by: Alysia Penna on: 02/15/2019 11:07 AM   Modules accepted: Orders

## 2019-02-21 ENCOUNTER — Ambulatory Visit (INDEPENDENT_AMBULATORY_CARE_PROVIDER_SITE_OTHER): Payer: 59 | Admitting: Family Medicine

## 2019-02-21 ENCOUNTER — Encounter: Payer: Self-pay | Admitting: Family Medicine

## 2019-02-21 ENCOUNTER — Other Ambulatory Visit: Payer: Self-pay

## 2019-02-21 VITALS — BP 121/84 | HR 58 | Temp 97.6°F | Resp 16 | Ht 67.5 in | Wt 209.4 lb

## 2019-02-21 DIAGNOSIS — I10 Essential (primary) hypertension: Secondary | ICD-10-CM

## 2019-02-21 DIAGNOSIS — Z125 Encounter for screening for malignant neoplasm of prostate: Secondary | ICD-10-CM

## 2019-02-21 DIAGNOSIS — Z0000 Encounter for general adult medical examination without abnormal findings: Secondary | ICD-10-CM

## 2019-02-21 DIAGNOSIS — Z23 Encounter for immunization: Secondary | ICD-10-CM | POA: Diagnosis not present

## 2019-02-21 DIAGNOSIS — R7301 Impaired fasting glucose: Secondary | ICD-10-CM | POA: Diagnosis not present

## 2019-02-21 LAB — COMPREHENSIVE METABOLIC PANEL
ALT: 53 U/L (ref 0–53)
AST: 26 U/L (ref 0–37)
Albumin: 4.7 g/dL (ref 3.5–5.2)
Alkaline Phosphatase: 57 U/L (ref 39–117)
BUN: 19 mg/dL (ref 6–23)
CO2: 25 mEq/L (ref 19–32)
Calcium: 9.7 mg/dL (ref 8.4–10.5)
Chloride: 104 mEq/L (ref 96–112)
Creatinine, Ser: 0.97 mg/dL (ref 0.40–1.50)
GFR: 81.4 mL/min (ref >60.00)
Glucose, Bld: 95 mg/dL (ref 70–99)
Potassium: 3.8 mEq/L (ref 3.5–5.1)
Sodium: 138 mEq/L (ref 135–145)
Total Bilirubin: 1.9 mg/dL — ABNORMAL HIGH (ref 0.2–1.2)
Total Protein: 6.9 g/dL (ref 6.0–8.3)

## 2019-02-21 LAB — LIPID PANEL
Cholesterol: 182 mg/dL (ref 0–200)
HDL: 44.6 mg/dL (ref >39.00)
LDL Cholesterol: 109 mg/dL — ABNORMAL HIGH (ref 0–99)
NonHDL: 137.77
Total CHOL/HDL Ratio: 4
Triglycerides: 143 mg/dL (ref 0.0–149.0)
VLDL: 28.6 mg/dL (ref 0.0–40.0)

## 2019-02-21 LAB — CBC WITH DIFFERENTIAL/PLATELET
Basophils Absolute: 0.1 10*3/uL (ref 0.0–0.1)
Basophils Relative: 0.8 % (ref 0.0–3.0)
Eosinophils Absolute: 0.2 10*3/uL (ref 0.0–0.7)
Eosinophils Relative: 3.3 % (ref 0.0–5.0)
HCT: 47.6 % (ref 39.0–52.0)
Hemoglobin: 16.5 g/dL (ref 13.0–17.0)
Lymphocytes Relative: 34.5 % (ref 12.0–46.0)
Lymphs Abs: 2.4 10*3/uL (ref 0.7–4.0)
MCHC: 34.6 g/dL (ref 30.0–36.0)
MCV: 89.7 fl (ref 78.0–100.0)
Monocytes Absolute: 0.4 10*3/uL (ref 0.1–1.0)
Monocytes Relative: 5.5 % (ref 3.0–12.0)
Neutro Abs: 3.9 10*3/uL (ref 1.4–7.7)
Neutrophils Relative %: 55.9 % (ref 43.0–77.0)
Platelets: 203 10*3/uL (ref 150.0–400.0)
RBC: 5.31 Mil/uL (ref 4.22–5.81)
RDW: 12.7 % (ref 11.5–15.5)
WBC: 7 10*3/uL (ref 4.0–10.5)

## 2019-02-21 LAB — HEMOGLOBIN A1C: Hgb A1c MFr Bld: 5.1 % (ref 4.6–6.5)

## 2019-02-21 LAB — TSH: TSH: 1.75 u[IU]/mL (ref 0.35–4.50)

## 2019-02-21 LAB — PSA: PSA: 0.78 ng/mL (ref 0.10–4.00)

## 2019-02-21 NOTE — Patient Instructions (Signed)

## 2019-02-21 NOTE — Progress Notes (Signed)
Office Note 02/21/2019  CC:  Chief Complaint  Patient presents with  . Annual Exam    pt is fasting    HPI:  Mark Gilmore is a 54 y.o. White male who is here for annual health maintenance exam.  Exercise: walking daily + wt lifting some. Dieting more lately. Working on getting wt down->his goal is 190s.  Some problems with erections the last year maybe, worse the last couple months. Libido is intact.   Past Medical History:  Diagnosis Date  . Carotid artery dissection (HCC)    left; spontaneous  . Colon cancer screening 01/2019   Cologuard NEG.  rpt 3 yrs.  . History of pneumonia   . History of shingles   . Hypertension   . IFG (impaired fasting glucose)    108 07/2018 f/u visit.  Check A1c with NEXT f/u labs.  . Obesity   . OSA on CPAP    Dx'd by home sleep study 04/2014.  On auto PAP since that time; most recent compliance monitoring 07/20/14 [facility], [place], [place]).  Compliance monitoring 12/2017 by Dr. Frances Furbish with [facility].  . Stroke (HCC) 04/2014   difficulty with word search, tightness in R arm.  Some residual spasticity in R arm.  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    Past Surgical History:  Procedure Laterality Date  . CAROTID STENT Left 08/23/2014   L carotid stent widely patent on carotid u/s by Dr. Allyson Sabal 02/09/16--repeat 1 yr same 12/2017.  . CHOLECYSTECTOMY  2006?  . ELECTROCARDIOGRAM  04/22/2014   [facility]  . HOME SLEEP STUDY  04/2014   Home sleep study: mild OSA  . VASECTOMY    . WISDOM TOOTH EXTRACTION      Family History  Problem Relation Age of Onset  . Hypertension Mother   . Diabetes Mother   . Stroke Mother 78       TIA's  . Dementia Mother   . Atrial fibrillation Mother        stent placement  . Alzheimer's disease Mother   . Hypertension Father   . Diabetes Father   . Hypertension Sister   . Diabetes Sister   .  Thyroid disease Sister   . Kidney disease Maternal Grandmother        Dialysis  . CVA Maternal Grandmother   . Thyroid disease Maternal Grandmother   . Congestive Heart Failure Maternal Grandfather   . CVA Maternal Grandfather   . Deep vein thrombosis Paternal Grandmother   . Asthma Daughter   . Breast cancer Maternal Aunt 34    Social History   Socioeconomic History  . Marital status: Married    Spouse name: Not on file  . Number of children: Not on file  . Years of education: Not on file  . Highest education level: Not on file  Occupational History  . Not on file  Tobacco Use  . Smoking status: Former Smoker    Quit date: 11/17/1996    Years since quitting: 22.2  . Smokeless tobacco: Former Engineer, water and Sexual Activity  . Alcohol use: No  . Drug use: No  . Sexual activity: Not on file  Other Topics Concern  . Not on file  Social History Narrative   Married, 4 kids.   Moved from [place] to [facility] 08/2016.   Educ: masters degree   Occup: Corporate investment banker.   Tob:  5 pack yr hx, quit about 1998.   No alcohol or drugs.   Social Determinants of Health   Financial Resource Strain:   . Difficulty of Paying Living Expenses: Not on file  Food Insecurity:   . Worried About Charity fundraiser in the Last Year: Not on file  . Ran Out of Food in the Last Year: Not on file  Transportation Needs:   . Lack of Transportation (Medical): Not on file  . Lack of Transportation (Non-Medical): Not on file  Physical Activity:   . Days of Exercise per Week: Not on file  . Minutes of Exercise per Session: Not on file  Stress:   . Feeling of Stress : Not on file  Social Connections:   . Frequency of Communication with Friends and Family: Not on file  . Frequency of Social Gatherings with Friends and Family: Not on file  . Attends Religious Services: Not on file  . Active Member of Clubs or Organizations: Not on file  . Attends Archivist  Meetings: Not on file  . Marital Status: Not on file  Intimate Partner Violence:   . Fear of Current or Ex-Partner: Not on file  . Emotionally Abused: Not on file  . Physically Abused: Not on file  . Sexually Abused: Not on file    Outpatient Medications Prior to Visit  Medication Sig Dispense Refill  . amLODipine (NORVASC) 5 MG tablet TAKE 1 TABLET BY MOUTH EVERY DAY 30 tablet 0  . Ascorbic Acid (VITAMIN C PO) Take by mouth daily.    Marland Kitchen aspirin EC 81 MG tablet Take 81 mg 2 (two) times daily by mouth.    . hydrochlorothiazide (HYDRODIURIL) 25 MG tablet TAKE 1 TABLET BY MOUTH EVERY DAY 30 tablet 0  . lisinopril (ZESTRIL) 40 MG tablet TAKE 1 TABLET BY MOUTH EVERY DAY 30 tablet 0  . MELATONIN ER PO Take by mouth at bedtime.    . Multiple Vitamins-Minerals (MULTIVITAMIN MEN 50+ PO) Take by mouth daily.    . Multiple Vitamins-Minerals (ZINC PO) Take by mouth daily.    Marland Kitchen VITAMIN D PO Take by mouth daily.     No facility-administered medications prior to visit.    No Known Allergies  ROS Review of Systems  Constitutional: Negative for appetite change, chills, fatigue and fever.  HENT: Negative for congestion, dental problem, ear pain and sore throat.   Eyes: Negative for discharge, redness and visual disturbance.  Respiratory: Negative for cough, chest tightness, shortness of breath and wheezing.   Cardiovascular: Negative for chest pain, palpitations and leg swelling.  Gastrointestinal: Negative for abdominal pain, blood in stool, diarrhea, nausea and vomiting.  Genitourinary: Negative for difficulty urinating, dysuria, flank pain, frequency, hematuria and urgency.  Musculoskeletal: Negative for arthralgias, back pain, joint swelling, myalgias and neck stiffness.  Skin: Negative for pallor and rash.  Neurological: Negative for dizziness, speech difficulty, weakness and headaches.  Hematological: Negative for adenopathy. Does not bruise/bleed easily.  Psychiatric/Behavioral: Negative  for confusion and sleep disturbance. The patient is not nervous/anxious.     PE; Blood pressure 121/84, pulse (!) 58, temperature 97.6 F (36.4 C), temperature source Temporal, resp. rate 16, height 5' 7.5" (1.715 m), weight 209 lb 6.4 oz (95 kg), SpO2 97 %. Body mass index is 32.31 kg/m.  Gen: Alert, well appearing.  Patient is oriented to person, place, time, and situation. AFFECT: pleasant, lucid thought and speech. ENT: Ears: EACs clear, normal epithelium.  TMs with good light reflex  and landmarks bilaterally.  Eyes: no injection, icteris, swelling, or exudate.  EOMI, PERRLA. Nose: no drainage or turbinate edema/swelling.  No injection or focal lesion.  Mouth: lips without lesion/swelling.  Oral mucosa pink and moist.  Dentition intact and without obvious caries or gingival swelling.  Oropharynx without erythema, exudate, or swelling.  Neck: supple/nontender.  No LAD, mass, or TM.  Carotid pulses 2+ bilaterally, without bruits. CV: RRR, no m/r/g.   LUNGS: CTA bilat, nonlabored resps, good aeration in all lung fields. ABD: soft, NT, ND, BS normal.  No hepatospenomegaly or mass.  No bruits. EXT: no clubbing, cyanosis, or edema.  Musculoskeletal: no joint swelling, erythema, warmth, or tenderness.  ROM of all joints intact. Skin - no sores or suspicious lesions or rashes or color changes Rectal exam: negative without mass, lesions or tenderness, PROSTATE EXAM: smooth and symmetric without nodules or tenderness.   Pertinent labs:  Lab Results  Component Value Date   TSH 1.22 02/17/2018   Lab Results  Component Value Date   WBC 6.5 02/17/2018   HGB 16.9 02/17/2018   HCT 48.6 02/17/2018   MCV 89.8 02/17/2018   PLT 212.0 02/17/2018   Lab Results  Component Value Date   CREATININE 0.85 08/17/2018   BUN 21 08/17/2018   NA 140 08/17/2018   K 3.7 08/17/2018   CL 106 08/17/2018   CO2 24 08/17/2018   Lab Results  Component Value Date   ALT 45 02/17/2018   AST 23 02/17/2018    ALKPHOS 48 02/17/2018   BILITOT 1.4 (H) 02/17/2018   Lab Results  Component Value Date   CHOL 198 02/17/2018   Lab Results  Component Value Date   HDL 42.10 02/17/2018   Lab Results  Component Value Date   LDLCALC 126 (H) 02/17/2018   Lab Results  Component Value Date   TRIG 149.0 02/17/2018   Lab Results  Component Value Date   CHOLHDL 5 02/17/2018   Lab Results  Component Value Date   PSA 0.89 02/17/2018    ASSESSMENT AND PLAN:   Health maintenance exam: Reviewed age and gender appropriate health maintenance issues (prudent diet, regular exercise, health risks of tobacco and excessive alcohol, use of seatbelts, fire alarms in home, use of sunscreen).  Also reviewed age and gender appropriate health screening as well as vaccine recommendations. Vaccines: Tdap UTD.  Flu -->given today.  He has had shingrix vaccine series. Labs: fasting HP, A1c (IFG), PSA. Prostate ca screening: DRE normal , PSA. Colon ca screening: cologuard NEG 01/2019->repeat 3 yrs.  Erectile dysfunction: some worsening over the last few months. He will consider trial of viagra or similar.  An After Visit Summary was printed and given to the patient.  FOLLOW UP:  Return in about 6 months (around 08/21/2019) for routine chronic illness f/u.  Signed:  Santiago Bumpers, MD           02/21/2019

## 2019-02-21 NOTE — Addendum Note (Signed)
Addended by: Emi Holes D on: 02/21/2019 08:51 AM   Modules accepted: Orders

## 2019-02-22 ENCOUNTER — Encounter: Payer: Self-pay | Admitting: Family Medicine

## 2019-02-23 ENCOUNTER — Other Ambulatory Visit: Payer: Self-pay

## 2019-02-23 DIAGNOSIS — I10 Essential (primary) hypertension: Secondary | ICD-10-CM

## 2019-02-26 ENCOUNTER — Other Ambulatory Visit: Payer: Self-pay | Admitting: Family Medicine

## 2019-05-16 ENCOUNTER — Other Ambulatory Visit: Payer: Self-pay

## 2019-05-16 ENCOUNTER — Encounter: Payer: Self-pay | Admitting: Cardiovascular Disease

## 2019-05-16 ENCOUNTER — Ambulatory Visit: Payer: 59 | Admitting: Cardiovascular Disease

## 2019-05-16 VITALS — BP 119/76 | HR 67 | Ht 67.5 in | Wt 210.0 lb

## 2019-05-16 DIAGNOSIS — I10 Essential (primary) hypertension: Secondary | ICD-10-CM | POA: Diagnosis not present

## 2019-05-16 DIAGNOSIS — E782 Mixed hyperlipidemia: Secondary | ICD-10-CM

## 2019-05-16 DIAGNOSIS — G4733 Obstructive sleep apnea (adult) (pediatric): Secondary | ICD-10-CM

## 2019-05-16 DIAGNOSIS — E785 Hyperlipidemia, unspecified: Secondary | ICD-10-CM | POA: Insufficient documentation

## 2019-05-16 DIAGNOSIS — Z9989 Dependence on other enabling machines and devices: Secondary | ICD-10-CM

## 2019-05-16 DIAGNOSIS — I7771 Dissection of carotid artery: Secondary | ICD-10-CM | POA: Diagnosis not present

## 2019-05-16 NOTE — Progress Notes (Signed)
05/16/2019 Georgina Pillion   [birth year]  [id number]  Primary Physician [name], Maryjean Morn, MD Primary Cardiologist: Runell Gess MD Nicholes Calamity, [place]  HPI:  Mark Gilmore is a 54 y.o.  mild to moderately overweight married Caucasian male father of 2 young daughters referred by Dr. Marvel Plan for cardiovascular evaluation because of a history of carotid artery dissection and subsequent stenting.  I last saw him in the office 01/12/2018.  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 He recently relocated from [place] to [place] in August 2018. His only cardiac risk factor is treated hypertension. He does have obstructive sleep apnea on CPAP. Never had a heart attack. He did have a spontaneous left carotid dissection April 2016 was on Coumadin for a short period time. He then underwent coronary artery stenting in July of that year [facility]. This followed noninvasively since then he has been asymptomatic with only mild right upper extremity residual.  Since I saw him in the office over a year ago he continues to do well.  He denies chest pain or shortness of breath.  He did have carotid Dopplers performed 12/14/2018 revealing his right carotid stent to be widely patent.  Recent lipid profile however did show an LDL of 109 currently not on statin therapy.  He did admit to dietary indiscretion however and since has changed his diet.   Current Meds  Medication Sig  . amLODipine (NORVASC) 5 MG tablet TAKE 1 TABLET BY MOUTH EVERY DAY  . Ascorbic Acid (VITAMIN C PO) Take by mouth daily.  Marland Kitchen aspirin EC 81 MG tablet Take 81 mg 2 (two) times daily by mouth.  . hydrochlorothiazide (HYDRODIURIL) 25 MG tablet TAKE 1 TABLET BY MOUTH EVERY DAY  . lisinopril (ZESTRIL) 40 MG tablet TAKE 1 TABLET BY MOUTH EVERY DAY  . MELATONIN ER PO Take by mouth at bedtime.  . Multiple Vitamins-Minerals (MULTIVITAMIN MEN 50+ PO) Take by mouth daily.  .  Multiple Vitamins-Minerals (ZINC PO) Take by mouth daily.  Marland Kitchen VITAMIN D PO Take by mouth daily.     No Known Allergies  Social History   Socioeconomic History  . Marital status: Married    Spouse name: Not on file  . Number of children: Not on file  . Years of education: Not on file  . Highest education level: Not on file  Occupational History  . Not on file  Tobacco Use  . Smoking status: Former Smoker    Quit date: 11/17/1996    Years since quitting: 22.5  . Smokeless tobacco: Former Engineer, water and Sexual Activity  . Alcohol use: No  . Drug use: No  . Sexual activity: Not on file  Other Topics Concern  . Not on file  Social History Narrative   Married, 4 kids.   Moved from [place] to [facility] 08/2016.   Educ: masters degree   Occup: Corporate investment banker.   Tob: 5 pack yr hx, quit about 1998.   No alcohol or drugs.   Social Determinants of Health   Financial Resource Strain:   . Difficulty of Paying Living Expenses:   Food Insecurity:   . Worried About Programme researcher, broadcasting/film/video in the Last Year:   . Barista in the Last Year:   Transportation Needs:   . Freight forwarder (Medical):   Marland Kitchen Lack of Transportation (Non-Medical):   Physical Activity:   .  Days of Exercise per Week:   . Minutes of Exercise per Session:   Stress:   . Feeling of Stress :   Social Connections:   . Frequency of Communication with Friends and Family:   . Frequency of Social Gatherings with Friends and Family:   . Attends Religious Services:   . Active Member of Clubs or Organizations:   . Attends Archivist Meetings:   Marland Kitchen Marital Status:   Intimate Partner Violence:   . Fear of Current or Ex-Partner:   . Emotionally Abused:   Marland Kitchen Physically Abused:   . Sexually Abused:      Review of Systems: General: negative for chills, fever, night sweats or weight changes.  Cardiovascular: negative for chest pain, dyspnea on exertion, edema, orthopnea,  palpitations, paroxysmal nocturnal dyspnea or shortness of breath Dermatological: negative for rash Respiratory: negative for cough or wheezing Urologic: negative for hematuria Abdominal: negative for nausea, vomiting, diarrhea, bright red blood per rectum, melena, or hematemesis Neurologic: negative for visual changes, syncope, or dizziness All other systems reviewed and are otherwise negative except as noted above.    Blood pressure 119/76, pulse 67, height 5' 7.5" (1.715 m), weight 210 lb (95.3 kg), SpO2 98 %.  General appearance: alert and no distress Neck: no adenopathy, no carotid bruit, no JVD, supple, symmetrical, trachea midline and thyroid not enlarged, symmetric, no tenderness/mass/nodules Lungs: clear to auscultation bilaterally Heart: regular rate and rhythm, S1, S2 normal, no murmur, click, rub or gallop Extremities: extremities normal, atraumatic, no cyanosis or edema Pulses: 2+ and symmetric Skin: Skin color, texture, turgor normal. No rashes or lesions Neurologic: Alert and oriented X 3, normal strength and tone. Normal symmetric reflexes. Normal coordination and gait  EKG sinus rhythm at 67 without ST or T wave changes.  I personally reviewed this EKG.  ASSESSMENT AND PLAN:   OSA on CPAP History of obstructive sleep apnea on CPAP  Hypertension History of essential hypertension blood pressure measured today at 119/76.  He is on amlodipine, lisinopril and hydrochlorothiazide.  Carotid artery dissection (HCC) History of spontaneous right carotid dissection on Coumadin for short period time status post carotid stenting at [facility].  We have been following him by duplex ultrasound annually most recently 12/14/2018 revealing a patent carotid stent.  Hyperlipidemia History of mild hyperlipidemia with lipid profile performed 02/21/2019 revealing total cholesterol 182, LDL 109 HDL 44 not on statin therapy.  He has modified his diet and scheduled to have repeat  lipid profile by his PCP in the next several weeks.      Lorretta Harp MD FACP,FACC,FAHA, [facility] 05/16/2019 9:09 AM

## 2019-05-16 NOTE — Patient Instructions (Signed)
Medication Instructions:  Your physician recommends that you continue on your current medications as directed. Please refer to the Current Medication list given to you today.  *If you need a refill on your cardiac medications before your next appointment, please call your pharmacy*  Testing/Procedures: Your physician has requested that you have a carotid duplex in NOVEMBER. This test is an ultrasound of the carotid arteries in your neck. It looks at blood flow through these arteries that supply the brain with blood. Allow one hour for this exam. There are no restrictions or special instructions.  Follow-Up: At [facility], you and your health needs are our priority.  As part of our continuing mission to provide you with exceptional heart care, we have created designated Provider Care Teams.  These Care Teams include your primary Cardiologist (physician) and Advanced Practice Providers (APPs -  Physician Assistants and Nurse Practitioners) who all work together to provide you with the care you need, when you need it.  We recommend signing up for the patient portal called "MyChart".  Sign up information is provided on this After Visit Summary.  MyChart is used to connect with patients for Virtual Visits (Telemedicine).  Patients are able to view lab/test results, encounter notes, upcoming appointments, etc.  Non-urgent messages can be sent to your provider as well.   To learn more about what you can do with MyChart, go to [email or web address].    Your next appointment:   12 month(s)  The format for your next appointment:   In Person  Provider:   Nanetta Batty, MD

## 2019-05-16 NOTE — Assessment & Plan Note (Signed)
History of obstructive sleep apnea on CPAP. 

## 2019-05-16 NOTE — Assessment & Plan Note (Signed)
History of mild hyperlipidemia with lipid profile performed 02/21/2019 revealing total cholesterol 182, LDL 109 HDL 44 not on statin therapy.  He has modified his diet and scheduled to have repeat lipid profile by his PCP in the next several weeks.

## 2019-05-16 NOTE — Assessment & Plan Note (Signed)
History of essential hypertension blood pressure measured today at 119/76.  He is on amlodipine, lisinopril and hydrochlorothiazide.

## 2019-05-16 NOTE — Assessment & Plan Note (Signed)
History of spontaneous right carotid dissection on Coumadin for short period time status post carotid stenting at [facility].  We have been following him by duplex ultrasound annually most recently 12/14/2018 revealing a patent carotid stent.

## 2019-05-25 ENCOUNTER — Ambulatory Visit: Payer: 59

## 2019-06-02 ENCOUNTER — Encounter: Payer: Self-pay | Admitting: Family Medicine

## 2019-06-02 ENCOUNTER — Other Ambulatory Visit: Payer: Self-pay

## 2019-06-02 ENCOUNTER — Ambulatory Visit (INDEPENDENT_AMBULATORY_CARE_PROVIDER_SITE_OTHER): Payer: 59 | Admitting: Family Medicine

## 2019-06-02 DIAGNOSIS — I10 Essential (primary) hypertension: Secondary | ICD-10-CM | POA: Diagnosis not present

## 2019-06-02 LAB — LIPID PANEL
Cholesterol: 183 mg/dL (ref 0–200)
HDL: 44.1 mg/dL (ref >39.00)
LDL Cholesterol: 114 mg/dL — ABNORMAL HIGH (ref 0–99)
NonHDL: 138.86
Total CHOL/HDL Ratio: 4
Triglycerides: 122 mg/dL (ref 0.0–149.0)
VLDL: 24.4 mg/dL (ref 0.0–40.0)

## 2019-06-02 LAB — ALT: ALT: 52 U/L (ref 0–53)

## 2019-06-02 LAB — AST: AST: 24 U/L (ref 0–37)

## 2019-06-08 ENCOUNTER — Other Ambulatory Visit: Payer: Self-pay

## 2019-06-08 DIAGNOSIS — E7800 Pure hypercholesterolemia, unspecified: Secondary | ICD-10-CM

## 2019-06-08 MED ORDER — ATORVASTATIN CALCIUM 20 MG PO TABS
20.0000 mg | ORAL_TABLET | Freq: Every day | ORAL | 2 refills | Status: DC
Start: 2019-06-08 — End: 2019-08-21

## 2019-08-10 ENCOUNTER — Encounter: Payer: Self-pay | Admitting: Family Medicine

## 2019-08-10 NOTE — Telephone Encounter (Signed)
Please advise, thanks. I believe warm water compresses and using J&J baby shampoo may be able to help. Any other suggestions?

## 2019-08-10 NOTE — Telephone Encounter (Signed)
I agree: warm water compresses with J and J shampoo for 5-10 min 2-3 times per day. If not improving in 4-5 d then needs to be seen as o/v.-thx

## 2019-08-21 ENCOUNTER — Other Ambulatory Visit: Payer: Self-pay

## 2019-08-21 ENCOUNTER — Other Ambulatory Visit: Payer: Self-pay | Admitting: Family Medicine

## 2019-08-21 ENCOUNTER — Ambulatory Visit (INDEPENDENT_AMBULATORY_CARE_PROVIDER_SITE_OTHER): Payer: 59 | Admitting: Family Medicine

## 2019-08-21 ENCOUNTER — Encounter: Payer: Self-pay | Admitting: Family Medicine

## 2019-08-21 VITALS — BP 109/77 | HR 69 | Temp 98.1°F | Resp 16 | Ht 67.5 in | Wt 210.2 lb

## 2019-08-21 DIAGNOSIS — I10 Essential (primary) hypertension: Secondary | ICD-10-CM

## 2019-08-21 DIAGNOSIS — N5201 Erectile dysfunction due to arterial insufficiency: Secondary | ICD-10-CM

## 2019-08-21 DIAGNOSIS — E7800 Pure hypercholesterolemia, unspecified: Secondary | ICD-10-CM | POA: Diagnosis not present

## 2019-08-21 LAB — BASIC METABOLIC PANEL
BUN: 18 mg/dL (ref 6–23)
CO2: 26 mEq/L (ref 19–32)
Calcium: 9.5 mg/dL (ref 8.4–10.5)
Chloride: 106 mEq/L (ref 96–112)
Creatinine, Ser: 0.94 mg/dL (ref 0.40–1.50)
GFR: 84.24 mL/min (ref >60.00)
Glucose, Bld: 100 mg/dL — ABNORMAL HIGH (ref 70–99)
Potassium: 3.9 mEq/L (ref 3.5–5.1)
Sodium: 140 mEq/L (ref 135–145)

## 2019-08-21 LAB — LIPID PANEL
Cholesterol: 128 mg/dL (ref 0–200)
HDL: 43.4 mg/dL (ref >39.00)
LDL Cholesterol: 54 mg/dL (ref 0–99)
NonHDL: 84.93
Total CHOL/HDL Ratio: 3
Triglycerides: 155 mg/dL — ABNORMAL HIGH (ref 0.0–149.0)
VLDL: 31 mg/dL (ref 0.0–40.0)

## 2019-08-21 MED ORDER — ATORVASTATIN CALCIUM 20 MG PO TABS: 20.0000 mg | ORAL_TABLET | Freq: Every day | ORAL | 3 refills | Status: DC

## 2019-08-21 MED ORDER — SILDENAFIL CITRATE 20 MG PO TABS: ORAL_TABLET | ORAL | 5 refills | Status: DC

## 2019-08-21 NOTE — Progress Notes (Signed)
OFFICE VISIT  08/21/2019   CC:  Chief Complaint  Patient presents with  . Follow-up    RCI, pt is fasting   HPI:    Patient is a 54 y.o. Caucasian male who presents for 6 mo f/u HTN, HLD.  HLD: started atorvastatin 20mg  3 mo ago.  Tolerating this med well. Trying to watch carbs. Exercise: walking daily for exercise.  HTN: no home bp measurements.  Wt stable at 210 lbs He does have ED: gets erection ok, lots of trouble maintaining erections. Occurring the last 1 yr or so. Wants to try viagra.  ROS: no fevers, no CP, no SOB, no wheezing, no cough, no dizziness, no HAs, no rashes, no melena/hematochezia.  No polyuria or polydipsia.  No myalgias or arthralgias.  No focal weakness, paresthesias, or tremors.  No acute vision or hearing abnormalities. No n/v/d or abd pain.  No palpitations.    Past Medical History:  Diagnosis Date  . Carotid artery dissection (HCC)    left; spontaneous  . Colon cancer screening 01/2019   Cologuard NEG.  rpt 3 yrs.  . History of pneumonia   . History of shingles   . Hypercholesterolemia    recommended statin jan and again may 2021  . Hypertension   . IFG (impaired fasting glucose)    108 07/2018 f/u visit.  Check A1c with NEXT f/u labs.  . Obesity   . OSA on CPAP    Dx'd by home sleep study 04/2014.  On auto PAP since that time; most recent compliance monitoring 07/20/14 [facility], [place], [place]).  Compliance monitoring 12/2017 by Dr. 01/2018 with [facility].  . Stroke (HCC) 04/2014   difficulty with word search, tightness in R arm.  Some residual spasticity in R arm.  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    Past Surgical History:  Procedure Laterality Date  . CAROTID STENT Left 08/23/2014   L carotid stent widely patent on carotid u/s by Dr. 08/25/2014 02/09/16--repeat 1 yr same 12/2017.  . CHOLECYSTECTOMY  2006?  . ELECTROCARDIOGRAM  04/22/2014    [facility]  . HOME SLEEP STUDY  04/2014   Home sleep study: mild OSA  . VASECTOMY    . WISDOM TOOTH EXTRACTION      Outpatient Medications Prior to Visit  Medication Sig Dispense Refill  . amLODipine (NORVASC) 5 MG tablet TAKE 1 TABLET BY MOUTH EVERY DAY 90 tablet 1  . Ascorbic Acid (VITAMIN C PO) Take by mouth daily.    05/2014 aspirin EC 81 MG tablet Take 81 mg 2 (two) times daily by mouth.    Marland Kitchen atorvastatin (LIPITOR) 20 MG tablet Take 1 tablet (20 mg total) by mouth daily. 30 tablet 2  . hydrochlorothiazide (HYDRODIURIL) 25 MG tablet TAKE 1 TABLET BY MOUTH EVERY DAY 90 tablet 1  . lisinopril (ZESTRIL) 40 MG tablet TAKE 1 TABLET BY MOUTH EVERY DAY 90 tablet 1  . MELATONIN ER PO Take by mouth at bedtime.    . Multiple Vitamins-Minerals (MULTIVITAMIN MEN 50+ PO) Take by mouth daily.    . Multiple Vitamins-Minerals (ZINC PO) Take by mouth daily.    Marland Kitchen VITAMIN D PO Take by mouth daily.     No facility-administered medications prior to visit.    No Known Allergies  ROS As per HPI  PE: Vitals with BMI 08/21/2019 05/16/2019 02/21/2019  Height 5' 7.5" 5' 7.5" 5' 7.5"  Weight 210 lbs 3 oz  210 lbs 209 lbs 6 oz  BMI 32.42 32.39 32.29  Systolic 109 119 502  Diastolic 77 76 84  Pulse 69 67 58  O2 sat today on RA is 97%  Gen: Alert, well appearing.  Patient is oriented to person, place, time, and situation. AFFECT: pleasant, lucid thought and speech. CV: RRR, no m/r/g.   LUNGS: CTA bilat, nonlabored resps, good aeration in all lung fields. EXT: no clubbing or cyanosis.  no edema.    LABS:  Lab Results  Component Value Date   TSH 1.75 02/21/2019   Lab Results  Component Value Date   WBC 7.0 02/21/2019   HGB 16.5 02/21/2019   HCT 47.6 02/21/2019   MCV 89.7 02/21/2019   PLT 203.0 02/21/2019   Lab Results  Component Value Date   CREATININE 0.97 02/21/2019   BUN 19 02/21/2019   NA 138 02/21/2019   K 3.8 02/21/2019   CL 104 02/21/2019   CO2 25 02/21/2019   Lab  Results  Component Value Date   ALT 52 06/02/2019   AST 24 06/02/2019   ALKPHOS 57 02/21/2019   BILITOT 1.9 (H) 02/21/2019   Lab Results  Component Value Date   CHOL 183 06/02/2019   Lab Results  Component Value Date   HDL 44.10 06/02/2019   Lab Results  Component Value Date   LDLCALC 114 (H) 06/02/2019   Lab Results  Component Value Date   TRIG 122.0 06/02/2019   Lab Results  Component Value Date   CHOLHDL 4 06/02/2019   Lab Results  Component Value Date   PSA 0.78 02/21/2019   PSA 0.89 02/17/2018   Lab Results  Component Value Date   HGBA1C 5.1 02/21/2019   IMPRESSION AND PLAN:  1) HLD: tolerating statin x 3 mo. FLP today.  2) HTN: stable/well controlled. Continue amlod 5mg , lisinopril 40 qd, and hctz 25. BMET today.  3) Erectile dysfunction: trial of viagra 20mg , 1-5 tabs po qd prn. Therapeutic expectations and side effect profile of medication discussed today.  Patient's questions answered.  An After Visit Summary was printed and given to the patient.  FOLLOW UP: Return in about 6 months (around 02/21/2020) for annual CPE (fasting).  Signed:  , MD           08/21/2019

## 2019-08-22 ENCOUNTER — Other Ambulatory Visit: Payer: Self-pay | Admitting: Family Medicine

## 2019-11-30 ENCOUNTER — Ambulatory Visit (HOSPITAL_COMMUNITY): Payer: 59

## 2019-12-04 ENCOUNTER — Other Ambulatory Visit: Payer: Self-pay | Admitting: Cardiovascular Disease

## 2019-12-04 ENCOUNTER — Other Ambulatory Visit: Payer: Self-pay

## 2019-12-04 ENCOUNTER — Ambulatory Visit (HOSPITAL_COMMUNITY)
Admission: RE | Admit: 2019-12-04 | Discharge: 2019-12-04 | Disposition: A | Discharge status: Home / Self Care | Payer: 59 | Source: Ambulatory Visit | Attending: Cardiology | Admitting: Cardiology

## 2019-12-04 DIAGNOSIS — I7771 Dissection of carotid artery: Secondary | ICD-10-CM | POA: Diagnosis present

## 2019-12-05 ENCOUNTER — Other Ambulatory Visit: Payer: Self-pay

## 2019-12-05 DIAGNOSIS — I7771 Dissection of carotid artery: Secondary | ICD-10-CM

## 2019-12-05 NOTE — Progress Notes (Signed)
vas 

## 2019-12-28 ENCOUNTER — Other Ambulatory Visit: Payer: Self-pay | Admitting: Family Medicine

## 2020-01-01 ENCOUNTER — Ambulatory Visit (INDEPENDENT_AMBULATORY_CARE_PROVIDER_SITE_OTHER): Payer: 59 | Admitting: Adult Health

## 2020-01-01 ENCOUNTER — Ambulatory Visit: Payer: 59 | Admitting: Adult Health

## 2020-01-01 ENCOUNTER — Encounter: Payer: Self-pay | Admitting: Adult Health

## 2020-01-01 VITALS — BP 117/79 | HR 62 | Ht 67.0 in | Wt 212.0 lb

## 2020-01-01 DIAGNOSIS — G4733 Obstructive sleep apnea (adult) (pediatric): Secondary | ICD-10-CM

## 2020-01-01 DIAGNOSIS — Z9989 Dependence on other enabling machines and devices: Secondary | ICD-10-CM

## 2020-01-01 NOTE — Progress Notes (Signed)
PATIENT: Mark Gilmore DOB: 1966/01/16  REASON FOR VISIT: follow up HISTORY FROM: patient  HISTORY OF PRESENT ILLNESS: Today 01/01/20: Mark Gilmore is a 54 year old male with a history of obstructive sleep apnea on CPAP.  His download indicates that he use his machine 35 out of 60 days for compliance of 58%.  He uses machine greater than 4 hours 32 days for compliance of 53%.  On average he uses his machine 6 hours and 8 minutes.  His residual AHI is 1.6 on 6 to 15 cm of water.  His leak in the 95th percentile is 4.5 L/min.  Reports that he traveled in November for his father surgery and did not take his CPAP with him.  This explains the days that he did not use his machine.  HISTORY 12/29/18 Mark Gilmore is a 54 year old male with a history of obstructive sleep apnea on CPAP.  He returns today for follow-up.  His download indicates that he use the machine 18 out of 30 days for compliance of 60%.  He uses machine greater than 4 hours each night.  On average he uses his machine 6 hours and 50 minutes.  His residual AHI is 1.6 on 6 to 15 cm of water with EPR of 1.  His leak in the 95th percentile is 15.5 L/min.  He reports that the CPAP continues to work well for him.  He denies any new issues.  HISTORY 12/28/2017: I reviewed his CPAP compliance data from 11/26/2017 through 12/25/2017 which is a total of 30 days, during which time he used his AutoPap 18 days with percent used days greater than 4 hours at 60%, indicating slightly suboptimal compliance with an average usage of 6 hours and 32 minutes, residual AHI at goal at 2 per hour, leak acceptable with the 95th percentile at 6.6 L/m, 95th percentile pressure at 9.9 cm with a range of 6 cm to 15 cm. He reports that AutoPap is going well. He benefits from treatment, he tends not to take it with him when he travels but other than that he is compliant with treatment and motivated to continue with it. He saw cardiology earlier this year and is supposed to have  a one-year checkup coming up in January 2020 with a carotid Doppler ultrasound also scheduled. Neurologically he has been stable, he tries to hydrate well with water and takes a baby aspirin, sometimes 2 a day.  REVIEW OF SYSTEMS: Out of a complete 14 system review of symptoms, the patient complains only of the following symptoms, and all other reviewed systems are negative.  ESS: 5 F SS 20 ALLERGIES: No Known Allergies  HOME MEDICATIONS: Outpatient Medications Prior to Visit  Medication Sig Dispense Refill  . amLODipine (NORVASC) 5 MG tablet TAKE 1 TABLET BY MOUTH EVERY DAY 90 tablet 1  . Ascorbic Acid (VITAMIN C PO) Take by mouth daily.    Marland Kitchen aspirin EC 81 MG tablet Take 81 mg 2 (two) times daily by mouth.    Marland Kitchen atorvastatin (LIPITOR) 20 MG tablet Take 1 tablet (20 mg total) by mouth daily. 90 tablet 3  . hydrochlorothiazide (HYDRODIURIL) 25 MG tablet TAKE 1 TABLET BY MOUTH EVERY DAY 90 tablet 1  . lisinopril (ZESTRIL) 40 MG tablet TAKE 1 TABLET BY MOUTH EVERY DAY 90 tablet 1  . MELATONIN ER PO Take by mouth at bedtime.    . Multiple Vitamins-Minerals (MULTIVITAMIN MEN 50+ PO) Take by mouth daily.    . Multiple Vitamins-Minerals (ZINC PO) Take  by mouth daily.    . sildenafil (REVATIO) 20 MG tablet TAKE 1 - 5 TABS BY MOUTH DAILY 30 TO 60 MINUTES PRIOR TO INTERCOURSE AS NEEDED. 90 tablet 0  . VITAMIN D PO Take by mouth daily.     No facility-administered medications prior to visit.    PAST MEDICAL HISTORY: Past Medical History:  Diagnosis Date  . Carotid artery dissection (HCC)    left; spontaneous  . Colon cancer screening 01/2019   Cologuard NEG.  rpt 3 yrs.  . History of pneumonia   . History of shingles   . Hypercholesterolemia    great response to atorva 2021  . Hypertension   . IFG (impaired fasting glucose)    108 07/2018 f/u visit.  Check A1c with NEXT f/u labs.  . Obesity   . OSA on CPAP    Dx'd by home sleep study 04/2014.  On auto PAP since that time; most recent  compliance monitoring 07/20/14 [facility], [place]  Compliance monitoring 12/2017 by Dr. Frances FurbishAthar with [facility].  . Stroke (HCC) 04/2014   difficulty with word search, tightness in R arm.  Some residual spasticity in R arm.  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    PAST SURGICAL HISTORY: Past Surgical History:  Procedure Laterality Date  . CAROTID STENT Left 08/23/2014   L carotid stent widely patent on carotid u/s by Dr. Allyson SabalBerry 02/09/16--repeat 1 yr same 12/2017.  . CHOLECYSTECTOMY  2006?  . ELECTROCARDIOGRAM  [facility]22/2014   [facility] [place]  . HOME SLEEP STUDY  04/2014   Home sleep study: mild OSA  . VASECTOMY    . WISDOM TOOTH EXTRACTION      FAMILY HISTORY: Family History  Problem Relation Age of Onset  . Hypertension Mother   . Diabetes Mother   . Stroke Mother [id number]       TIA's  . Dementia Mother   . Atrial fibrillation Mother        stent placement  . Alzheimer's disease Mother   . Hypertension Father   . Diabetes Father   . Hypertension Sister   . Diabetes Sister   . Thyroid disease Sister   . Kidney disease Maternal Grandmother        Dialysis  . CVA Maternal Grandmother   . Thyroid disease Maternal Grandmother   . Congestive Heart Failure Maternal Grandfather   . CVA Maternal Grandfather   . Deep vein thrombosis Paternal Grandmother   . Asthma Daughter   . Breast cancer Maternal Aunt [id number]    SOCIAL HISTORY: Social History   Socioeconomic History  . Marital status: Married    Spouse name: Not on file  . Number of children: Not on file  . Years of education: Not on file  . Highest education level: Not on file  Occupational History  . Not on file  Tobacco Use  . Smoking status: Former Smoker    Quit date: 11/17/1996    Years since quitting: 23.1  . Smokeless tobacco: Former Clinical biochemistUser  Vaping Use  . Vaping Use: Never used  Substance and Sexual Activity   . Alcohol use: No  . Drug use: No  . Sexual activity: Not on file  Other Topics Concern  . Not on file  Social History Narrative   Married, 4 kids.   [place]   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   Tob: 5 pack yr hx, quit about 1998.   No alcohol or drugs.   Social Determinants of Health   Financial Resource Strain:   . Difficulty of Paying Living Expenses: Not on file  Food Insecurity:   . Worried About Programme researcher, broadcasting/film/video in the Last Year: Not on file  . Ran Out of Food in the Last Year: Not on file  Transportation Needs:   . Lack of Transportation (Medical): Not on file  . Lack of Transportation (Non-Medical): Not on file  Physical Activity:   . Days of Exercise per Week: Not on file  . Minutes of Exercise per Session: Not on file  Stress:   . Feeling of Stress : Not on file  Social Connections:   . Frequency of Communication with Friends and Family: Not on file  . Frequency of Social Gatherings with Friends and Family: Not on file  . Attends Religious Services: Not on file  . Active Member of Clubs or Organizations: Not on file  . Attends Banker Meetings: Not on file  . Marital Status: Not on file  Intimate Partner Violence:   . Fear of Current or Ex-Partner: Not on file  . Emotionally Abused: Not on file  . Physically Abused: Not on file  . Sexually Abused: Not on file      PHYSICAL EXAM  Vitals:   01/01/20 0957  BP: 117/79  Pulse: 62  Weight: 212 lb (96.2 kg)  Height: 5\' 7"  (1.702 m)   Body mass index is 33.2 kg/m.  Generalized: Well developed, in no acute distress  Chest: Lungs clear to auscultation bilaterally  Neurological examination  Mentation: Alert oriented to time, place, history taking. Follows all commands speech and language fluent Cranial nerve II-XII: Extraocular movements were full, visual field were full on confrontational test Head turning and  shoulder shrug  were normal and symmetric. Motor: The motor testing reveals 5 over 5 strength of all 4 extremities. Good symmetric motor tone is noted throughout.  Sensory: Sensory testing is intact to soft touch on all 4 extremities. No evidence of extinction is noted.  Gait and station: Gait is normal.    DIAGNOSTIC DATA (LABS, IMAGING, TESTING) - I reviewed patient records, labs, notes, testing and imaging myself where available.  Lab Results  Component Value Date   WBC 7.0 02/21/2019   HGB 16.5 02/21/2019   HCT 47.6 02/21/2019   MCV 89.7 02/21/2019   PLT 203.0 02/21/2019      Component Value Date/Time   NA 140 08/21/2019 0849   K 3.9 08/21/2019 0849   CL 106 08/21/2019 0849   CO2 26 08/21/2019 0849   GLUCOSE 100 (H) 08/21/2019 0849   BUN 18 08/21/2019 0849   CREATININE 0.94 08/21/2019 0849   CALCIUM 9.5 08/21/2019 0849   PROT 6.9 02/21/2019 0840   ALBUMIN 4.7 02/21/2019 0840   AST 24 06/02/2019 0853   ALT 52 06/02/2019 0853   ALKPHOS 57 02/21/2019 0840   BILITOT 1.9 (H) 02/21/2019 0840   Lab Results  Component Value Date   CHOL 128 08/21/2019   HDL 43.40 08/21/2019   LDLCALC 54 08/21/2019   TRIG 155.0 (H) 08/21/2019   CHOLHDL 3 08/21/2019    Lab Results  Component Value Date   TSH 1.75 02/21/2019      ASSESSMENT AND PLAN 54 y.o. year old male  has a past medical history of Carotid artery dissection (HCC), Colon cancer screening (01/2019), History of pneumonia,  History of shingles, Hypercholesterolemia, Hypertension, IFG (impaired fasting glucose), Obesity, OSA on CPAP, and Stroke (HCC) (04/2014). here with:  1. Obstructive sleep apnea on CPAP  The patient's CPAP download shows suboptimal compliance but good treatment of his apnea when he uses his machine.  He is encouraged to continue using CPAP nightly and greater than 4 hours each night.  He is advised that if his symptoms worsen or he develops new symptoms he should let us know.  He will follow-up in 1  year or sooner if needed   I spent 20 minutes of face-to-face and non-face-to-face time with patient.  This included previsit chart review, lab review, study review, order entry, electronic health record documentation, patient education.    Butch Penny, MSN, NP-C 01/01/2020, 10:14 AM [name] [address], [address] [place], [place] [address] [phone]  I reviewed the above note and documentation by the Nurse Practitioner and agree with the history, exam, assessment and plan as outlined above. I was available for consultation. Huston Foley, MD, PhD [name])

## 2020-01-01 NOTE — Patient Instructions (Signed)
Continue using CPAP nightly and greater than 4 hours each night °If your symptoms worsen or you develop new symptoms please let us know.  ° °

## 2020-02-19 ENCOUNTER — Other Ambulatory Visit: Payer: Self-pay | Admitting: Family Medicine

## 2020-04-12 ENCOUNTER — Encounter: Payer: Self-pay | Admitting: Family Medicine

## 2020-04-12 ENCOUNTER — Telehealth (INDEPENDENT_AMBULATORY_CARE_PROVIDER_SITE_OTHER): Payer: 59 | Admitting: Family Medicine

## 2020-04-12 DIAGNOSIS — J209 Acute bronchitis, unspecified: Secondary | ICD-10-CM | POA: Diagnosis not present

## 2020-04-12 MED ORDER — BENZONATATE 200 MG PO CAPS: 200.0000 mg | ORAL_CAPSULE | Freq: Two times a day (BID) | ORAL | 0 refills | Status: DC | PRN

## 2020-04-12 MED ORDER — PREDNISONE 20 MG PO TABS: ORAL_TABLET | ORAL | 0 refills | Status: DC

## 2020-04-12 MED ORDER — AMOXICILLIN-POT CLAVULANATE 875-125 MG PO TABS
1.0000 | ORAL_TABLET | Freq: Two times a day (BID) | ORAL | 0 refills | Status: DC
Start: 2020-04-12 — End: 2020-04-19

## 2020-04-12 NOTE — Patient Instructions (Signed)

## 2020-04-12 NOTE — Progress Notes (Signed)
VIRTUAL VISIT VIA VIDEO  I connected with Mark Gilmore on 04/12/20 at 11:00 AM EDT by elemedicine application and verified that I am speaking with the correct person using two identifiers. Location patient: Home Location provider: [facility], Office Persons participating in the virtual visit: Patient, Dr. Claiborne Billings and Lilia Pro, CMA  I discussed the limitations of evaluation and management by telemedicine and the availability of in person appointments. The patient expressed understanding and agreed to proceed.   SUBJECTIVE Chief Complaint  Patient presents with  . Cough    Pt c/o HA, cough, chest congestion, sore throat x 4 days; pt is recovering from GI virus PMHx PNA and bronchitis;     HPI: Mark Gilmore is a 55 y.o. male present for acute illness. He suffered from a viral illness early last week with GI symptoms. About a week in to his GI symptoms he began symptoms of productive cough, sore throat, nasal congestion, headache and wheezing. He denies fever, chills. His appetite has returned and he is now tolerating PO. He has a h/o pneumonia, and since that time he seems to hold on to bronchitis symptoms.  Tested negative for covid.   ROS: See pertinent positives and negatives per HPI.  Patient Active Problem List   Diagnosis Date Noted  . Hyperlipidemia 05/16/2019  . OSA on CPAP   . Obesity   . Hypertension   . Carotid artery dissection (HCC)   . Stroke [facility] 04/27/2014    Social History   Tobacco Use  . Smoking status: Former Smoker    Quit date: 11/17/1996    Years since quitting: 23.4  . Smokeless tobacco: Former Engineer, water Use Topics  . Alcohol use: No    Current Outpatient Medications:  .  amLODipine (NORVASC) 5 MG tablet, TAKE 1 TABLET BY MOUTH EVERY DAY, Disp: 90 tablet, Rfl: 0 .  amoxicillin-clavulanate (AUGMENTIN) 875-125 MG tablet, Take 1 tablet by mouth 2 (two) times daily., Disp: 20 tablet, Rfl: 0 .  Ascorbic Acid (VITAMIN C PO), Take by  mouth daily., Disp: , Rfl:  .  aspirin EC 81 MG tablet, Take 81 mg 2 (two) times daily by mouth., Disp: , Rfl:  .  atorvastatin (LIPITOR) 20 MG tablet, Take 1 tablet (20 mg total) by mouth daily., Disp: 90 tablet, Rfl: 3 .  benzonatate (TESSALON) 200 MG capsule, Take 1 capsule (200 mg total) by mouth 2 (two) times daily as needed for cough., Disp: 20 capsule, Rfl: 0 .  hydrochlorothiazide (HYDRODIURIL) 25 MG tablet, TAKE 1 TABLET BY MOUTH EVERY DAY, Disp: 90 tablet, Rfl: 0 .  lisinopril (ZESTRIL) 40 MG tablet, TAKE 1 TABLET BY MOUTH EVERY DAY, Disp: 90 tablet, Rfl: 0 .  MELATONIN ER PO, Take by mouth at bedtime., Disp: , Rfl:  .  Multiple Vitamins-Minerals (MULTIVITAMIN MEN 50+ PO), Take by mouth daily., Disp: , Rfl:  .  Multiple Vitamins-Minerals (ZINC PO), Take by mouth daily., Disp: , Rfl:  .  predniSONE (DELTASONE) 20 MG tablet, 60 mg x3d, 40 mg x3d, 20 mg x2d, 10 mg x2d, Disp: 18 tablet, Rfl: 0 .  sildenafil (REVATIO) 20 MG tablet, TAKE 1 - 5 TABS BY MOUTH DAILY 30 TO 60 MINUTES PRIOR TO INTERCOURSE AS NEEDED., Disp: 90 tablet, Rfl: 0 .  VITAMIN D PO, Take by mouth daily., Disp: , Rfl:   No Known Allergies  OBJECTIVE: There were no vitals taken for this visit. Gen: No acute distress. Nontoxic in appearance.  HENT: AT. [place].  MMM. Nasal congestion present.  Eyes:Pupils Equal Round Reactive to light, Extraocular movements intact,  Conjunctiva without redness, discharge or icterus. Chest: Cough presnet Skin: no rashes, purpura or petechiae.  Neuro: Alert. Oriented x3  Psych: Normal affect and demeanor. Normal speech. Normal thought content and judgment.  ASSESSMENT AND PLAN: Abdoulie Gilmore is a 55 y.o. male present for  Acute bronchitis with symptoms > 10 days Viral illness last week with GI sx, now with resp sx.  Rest, hydrate.  +/- flonase, mucinex (DM if cough), nettie pot or nasal saline.  Augmentin and prednisone prescribed, take until completed.  Tessalon perles for cough.  If  cough present it can last up to 6-8 weeks.  F/U with PCP in 2 weeks if not improved, sooner if worsening.     Felix Pacini, DO 04/12/2020  No orders of the defined types were placed in this encounter.  Meds ordered this encounter  Medications  . amoxicillin-clavulanate (AUGMENTIN) 875-125 MG tablet    Sig: Take 1 tablet by mouth 2 (two) times daily.    Dispense:  20 tablet    Refill:  0  . predniSONE (DELTASONE) 20 MG tablet    Sig: 60 mg x3d, 40 mg x3d, 20 mg x2d, 10 mg x2d    Dispense:  18 tablet    Refill:  0  . benzonatate (TESSALON) 200 MG capsule    Sig: Take 1 capsule (200 mg total) by mouth 2 (two) times daily as needed for cough.    Dispense:  20 capsule    Refill:  0   Referral Orders  No referral(s) requested today

## 2020-04-19 ENCOUNTER — Ambulatory Visit (INDEPENDENT_AMBULATORY_CARE_PROVIDER_SITE_OTHER): Payer: BC Managed Care – PPO | Admitting: Family Medicine

## 2020-04-19 ENCOUNTER — Encounter: Payer: Self-pay | Admitting: Family Medicine

## 2020-04-19 VITALS — BP 119/81 | HR 74 | Temp 98.4°F | Ht 67.0 in | Wt 212.8 lb

## 2020-04-19 DIAGNOSIS — J209 Acute bronchitis, unspecified: Secondary | ICD-10-CM

## 2020-04-19 DIAGNOSIS — Z8701 Personal history of pneumonia (recurrent): Secondary | ICD-10-CM | POA: Diagnosis not present

## 2020-04-19 MED ORDER — PREDNISONE 20 MG PO TABS: ORAL_TABLET | ORAL | 0 refills | Status: DC

## 2020-04-19 MED ORDER — ALBUTEROL SULFATE HFA 108 (90 BASE) MCG/ACT IN AERS: 2.0000 | INHALATION_SPRAY | RESPIRATORY_TRACT | 0 refills | Status: DC | PRN

## 2020-04-19 MED ORDER — DOXYCYCLINE HYCLATE 100 MG PO CAPS: 100.0000 mg | ORAL_CAPSULE | Freq: Two times a day (BID) | ORAL | 0 refills | Status: AC

## 2020-04-19 NOTE — Telephone Encounter (Signed)
Spoke with pt, recommendations provided. Agreed with appt and time works for him. Will add to the schedule

## 2020-04-19 NOTE — Progress Notes (Signed)
OFFICE VISIT  04/19/2020  CC:  Chief Complaint  Patient presents with  . shob    Started last week and is not feeling better.    HPI:    Patient is a 55 y.o. Caucasian male who presents for respiratory complaints. Two week hx of cough with some mucous production, chest heaviness, feels hard to breath.  +Nasal cong, runny nose some.  ST initially but that is gone lately. Some wheezing. No fevers.  Says all of his resp sx's actually started after having an acute n/v/d illness for a couple days.  Covid testing was neg early in the course of this illness.  He was seen by Dr. Claiborne Billings 04/12/20 and he is finishing up a course of augmentin and a prednisone taper rx'd by her. Albuterol neb (his daughter's med) last night helped. Eating and drinking fine.  Past Medical History:  Diagnosis Date  . Carotid artery dissection (HCC)    left; spontaneous  . Colon cancer screening 01/2019   Cologuard NEG.  rpt 3 yrs.  . History of pneumonia   . History of shingles   . Hypercholesterolemia    great response to atorva 2021  . Hypertension   . IFG (impaired fasting glucose)    108 07/2018 f/u visit.  Check A1c with NEXT f/u labs.  . Obesity   . OSA on CPAP    Dx'd by home sleep study 04/2014.  On auto PAP since that time; most recent compliance monitoring 07/20/14 [facility] [place], [place]).  Compliance monitoring 12/2017 by Dr. Frances Furbish with [facility].  . Stroke (HCC) 04/2014   difficulty with word search, tightness in R arm.  Some residual spasticity in R arm.  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    Past Surgical History:  Procedure Laterality Date  . CAROTID STENT Left 08/23/2014   L carotid stent widely patent on carotid u/s by Dr. Allyson Sabal 02/09/16--repeat 1 yr same 12/2017.  . CHOLECYSTECTOMY  2006?  . ELECTROCARDIOGRAM  04/22/2014   [facility]  . HOME SLEEP STUDY  04/2014   Home  sleep study: mild OSA  . VASECTOMY    . WISDOM TOOTH EXTRACTION      Outpatient Medications Prior to Visit  Medication Sig Dispense Refill  . amLODipine (NORVASC) 5 MG tablet TAKE 1 TABLET BY MOUTH EVERY DAY 90 tablet 0  . Ascorbic Acid (VITAMIN C PO) Take by mouth daily.    Marland Kitchen aspirin EC 81 MG tablet Take 81 mg 2 (two) times daily by mouth.    Marland Kitchen atorvastatin (LIPITOR) 20 MG tablet Take 1 tablet (20 mg total) by mouth daily. 90 tablet 3  . benzonatate (TESSALON) 200 MG capsule Take 1 capsule (200 mg total) by mouth 2 (two) times daily as needed for cough. 20 capsule 0  . hydrochlorothiazide (HYDRODIURIL) 25 MG tablet TAKE 1 TABLET BY MOUTH EVERY DAY 90 tablet 0  . lisinopril (ZESTRIL) 40 MG tablet TAKE 1 TABLET BY MOUTH EVERY DAY 90 tablet 0  . MELATONIN ER PO Take by mouth at bedtime.    . Multiple Vitamins-Minerals (MULTIVITAMIN MEN 50+ PO) Take by mouth daily.    . Multiple Vitamins-Minerals (ZINC PO) Take by mouth daily.    . sildenafil (REVATIO) 20 MG tablet TAKE 1 - 5 TABS BY MOUTH DAILY 30 TO 60 MINUTES PRIOR TO INTERCOURSE AS NEEDED. 90 tablet 0  . VITAMIN D PO Take by mouth daily.    Marland Kitchen  amoxicillin-clavulanate (AUGMENTIN) 875-125 MG tablet Take 1 tablet by mouth 2 (two) times daily. 20 tablet 0  . predniSONE (DELTASONE) 20 MG tablet 60 mg x3d, 40 mg x3d, 20 mg x2d, 10 mg x2d 18 tablet 0   No facility-administered medications prior to visit.    No Known Allergies  ROS As per HPI  PE: Vitals with BMI 04/19/2020 01/01/2020 08/21/2019  Height 5\' 7"  5\' 7"  5' 7.5"  Weight 212 lbs 13 oz 212 lbs 210 lbs 3 oz  BMI 33.32 33.2 32.42  Systolic 119 117  Diastolic 81 79 77  Pulse 74 62 69   Gen: Alert, well appearing.  Patient is oriented to person, place, time, and situation. AFFECT: pleasant, lucid thought and speech. : no injection, icteris, swelling, or exudate.  EOMI, PERRLA. Mouth: lips without lesion/swelling.  Oral mucosa pink and moist. Oropharynx without  erythema, exudate, or swelling.  CV: RRR, no m/r/g.   LUNGS: CTA bilat, nonlabored resps, good aeration in all lung fields. EXT: no clubbing or cyanosis.  no edema.    LABS:    Chemistry      Component Value Date/Time   NA 140 08/21/2019 0849   K 3.9 08/21/2019 0849   CL 106 08/21/2019 0849   CO2 26 08/21/2019 0849   BUN 18 08/21/2019 0849   CREATININE 0.94 08/21/2019 0849      Component Value Date/Time   CALCIUM 9.5 08/21/2019 0849   ALKPHOS 57 02/21/2019 0840   AST 24 06/02/2019 0853   ALT 52 06/02/2019 0853   BILITOT 1.9 (H) 02/21/2019 0840     Lab Results  Component Value Date   WBC 7.0 02/21/2019   HGB 16.5 02/21/2019   HCT 47.6 02/21/2019   MCV 89.7 02/21/2019   PLT 203.0 02/21/2019   IMPRESSION AND PLAN:  Acute upper and lower resp tract infection, +RAD. Not improving on > 1wk of augmentin and prednisone. Albuterol very helpful last night.  Plan is to extend prednisone (40 qd x 5, then 20 qd x 5), change over to doxycycline 100 bid x 10d, and get started on albuterol q4h prn.  Overdue for cpe/labs--he'll make appt at his convenience sometime in the next 3-4 months.  An After Visit Summary was printed and given to the patient.  FOLLOW UP: Return for 3-4 mo CPE/RCI--fasting.  Signed:  02/23/2019, MD           04/19/2020

## 2020-04-19 NOTE — Telephone Encounter (Signed)
Recommend 4PM in-person visit with me today

## 2020-05-18 ENCOUNTER — Other Ambulatory Visit: Payer: Self-pay | Admitting: Family Medicine

## 2020-06-14 ENCOUNTER — Other Ambulatory Visit: Payer: Self-pay | Admitting: Family Medicine

## 2020-07-04 ENCOUNTER — Encounter: Payer: BC Managed Care – PPO | Admitting: Family Medicine

## 2020-07-05 ENCOUNTER — Encounter: Payer: BC Managed Care – PPO | Admitting: Family Medicine

## 2020-07-08 ENCOUNTER — Other Ambulatory Visit: Payer: Self-pay | Admitting: Family Medicine

## 2020-07-08 MED ORDER — LISINOPRIL 40 MG PO TABS: 40.0000 mg | ORAL_TABLET | Freq: Every day | ORAL | 0 refills | Status: DC

## 2020-07-08 MED ORDER — HYDROCHLOROTHIAZIDE 25 MG PO TABS: 1.0000 | ORAL_TABLET | Freq: Every day | ORAL | 0 refills | Status: DC

## 2020-07-11 ENCOUNTER — Encounter: Payer: Self-pay | Admitting: Family Medicine

## 2020-07-11 ENCOUNTER — Other Ambulatory Visit: Payer: Self-pay

## 2020-07-11 ENCOUNTER — Ambulatory Visit (INDEPENDENT_AMBULATORY_CARE_PROVIDER_SITE_OTHER): Payer: BC Managed Care – PPO | Admitting: Family Medicine

## 2020-07-11 VITALS — BP 106/67 | HR 62 | Temp 97.5°F | Resp 16 | Ht 67.0 in | Wt 210.4 lb

## 2020-07-11 DIAGNOSIS — Z1159 Encounter for screening for other viral diseases: Secondary | ICD-10-CM | POA: Diagnosis not present

## 2020-07-11 DIAGNOSIS — E7800 Pure hypercholesterolemia, unspecified: Secondary | ICD-10-CM | POA: Diagnosis not present

## 2020-07-11 DIAGNOSIS — I10 Essential (primary) hypertension: Secondary | ICD-10-CM

## 2020-07-11 DIAGNOSIS — Z23 Encounter for immunization: Secondary | ICD-10-CM

## 2020-07-11 DIAGNOSIS — Z125 Encounter for screening for malignant neoplasm of prostate: Secondary | ICD-10-CM

## 2020-07-11 DIAGNOSIS — Z0000 Encounter for general adult medical examination without abnormal findings: Secondary | ICD-10-CM | POA: Diagnosis not present

## 2020-07-11 LAB — CBC WITH DIFFERENTIAL/PLATELET
Basophils Absolute: 0.1 10*3/uL (ref 0.0–0.1)
Basophils Relative: 0.8 % (ref 0.0–3.0)
Eosinophils Absolute: 0.2 10*3/uL (ref 0.0–0.7)
Eosinophils Relative: 3.4 % (ref 0.0–5.0)
HCT: 46.8 % (ref 39.0–52.0)
Hemoglobin: 16.4 g/dL (ref 13.0–17.0)
Lymphocytes Relative: 39.4 % (ref 12.0–46.0)
Lymphs Abs: 2.6 10*3/uL (ref 0.7–4.0)
MCHC: 35 g/dL (ref 30.0–36.0)
MCV: 89.9 fl (ref 78.0–100.0)
Monocytes Absolute: 0.4 10*3/uL (ref 0.1–1.0)
Monocytes Relative: 6.3 % (ref 3.0–12.0)
Neutro Abs: 3.3 10*3/uL (ref 1.4–7.7)
Neutrophils Relative %: 50.1 % (ref 43.0–77.0)
Platelets: 213 10*3/uL (ref 150.0–400.0)
RBC: 5.21 Mil/uL (ref 4.22–5.81)
RDW: 13.3 % (ref 11.5–15.5)
WBC: 6.6 10*3/uL (ref 4.0–10.5)

## 2020-07-11 LAB — LIPID PANEL
Cholesterol: 207 mg/dL — ABNORMAL HIGH (ref 0–200)
HDL: 48.8 mg/dL (ref >39.00)
NonHDL: 158.63
Total CHOL/HDL Ratio: 4
Triglycerides: 202 mg/dL — ABNORMAL HIGH (ref 0.0–149.0)
VLDL: 40.4 mg/dL — ABNORMAL HIGH (ref 0.0–40.0)

## 2020-07-11 LAB — COMPREHENSIVE METABOLIC PANEL
ALT: 47 U/L (ref 0–53)
AST: 24 U/L (ref 0–37)
Albumin: 4.8 g/dL (ref 3.5–5.2)
Alkaline Phosphatase: 58 U/L (ref 39–117)
BUN: 17 mg/dL (ref 6–23)
CO2: 26 mEq/L (ref 19–32)
Calcium: 9.9 mg/dL (ref 8.4–10.5)
Chloride: 104 mEq/L (ref 96–112)
Creatinine, Ser: 0.92 mg/dL (ref 0.40–1.50)
GFR: 95.32 mL/min (ref >60.00)
Glucose, Bld: 90 mg/dL (ref 70–99)
Potassium: 3.7 mEq/L (ref 3.5–5.1)
Sodium: 140 mEq/L (ref 135–145)
Total Bilirubin: 2 mg/dL — ABNORMAL HIGH (ref 0.2–1.2)
Total Protein: 7.2 g/dL (ref 6.0–8.3)

## 2020-07-11 LAB — PSA: PSA: 0.69 ng/mL (ref 0.10–4.00)

## 2020-07-11 LAB — TSH: TSH: 1.7 u[IU]/mL (ref 0.35–4.50)

## 2020-07-11 LAB — LDL CHOLESTEROL, DIRECT: Direct LDL: 140 mg/dL

## 2020-07-11 MED ORDER — LISINOPRIL 40 MG PO TABS: 40.0000 mg | ORAL_TABLET | Freq: Every day | ORAL | 3 refills | Status: DC

## 2020-07-11 MED ORDER — ATORVASTATIN CALCIUM 20 MG PO TABS: 20.0000 mg | ORAL_TABLET | Freq: Every day | ORAL | 3 refills | Status: DC

## 2020-07-11 MED ORDER — AMLODIPINE BESYLATE 5 MG PO TABS: 1.0000 | ORAL_TABLET | Freq: Every day | ORAL | 3 refills | Status: DC

## 2020-07-11 MED ORDER — HYDROCHLOROTHIAZIDE 25 MG PO TABS: 1.0000 | ORAL_TABLET | Freq: Every day | ORAL | 3 refills | Status: DC

## 2020-07-11 NOTE — Patient Instructions (Signed)
Health Maintenance, Male Adopting a healthy lifestyle and getting preventive care are important in promoting health and wellness. Ask your health care provider about: The right schedule for you to have regular tests and exams. Things you can do on your own to prevent diseases and keep yourself healthy. What should I know about diet, weight, and exercise? Eat a healthy diet  Eat a diet that includes plenty of vegetables, fruits, low-fat dairy products, and lean protein. Do not eat a lot of foods that are high in solid fats, added sugars, or sodium.  Maintain a healthy weight Body mass index (BMI) is a measurement that can be used to identify possible weight problems. It estimates body fat based on height and weight. Your health care provider can help determine your BMI and help you achieve or maintain ahealthy weight. Get regular exercise Get regular exercise. This is one of the most important things you can do for your health. Most adults should: Exercise for at least 150 minutes each week. The exercise should increase your heart rate and make you sweat (moderate-intensity exercise). Do strengthening exercises at least twice a week. This is in addition to the moderate-intensity exercise. Spend less time sitting. Even light physical activity can be beneficial. Watch cholesterol and blood lipids Have your blood tested for lipids and cholesterol at 53 years of age, then havethis test every 5 years. You may need to have your cholesterol levels checked more often if: Your lipid or cholesterol levels are high. You are older than 53 years of age. You are at high risk for heart disease. What should I know about cancer screening? Many types of cancers can be detected early and may often be prevented. Depending on your health history and family history, you may need to have cancer screening at various ages. This may include screening for: Colorectal cancer. Prostate cancer. Skin cancer. Lung  cancer. What should I know about heart disease, diabetes, and high blood pressure? Blood pressure and heart disease High blood pressure causes heart disease and increases the risk of stroke. This is more likely to develop in people who have high blood pressure readings, are of African descent, or are overweight. Talk with your health care provider about your target blood pressure readings. Have your blood pressure checked: Every 3-5 years if you are 18-39 years of age. Every year if you are 40 years old or older. If you are between the ages of 65 and 75 and are a current or former smoker, ask your health care provider if you should have a one-time screening for abdominal aortic aneurysm (AAA). Diabetes Have regular diabetes screenings. This checks your fasting blood sugar level. Have the screening done: Once every three years after age 45 if you are at a normal weight and have a low risk for diabetes. More often and at a younger age if you are overweight or have a high risk for diabetes. What should I know about preventing infection? Hepatitis B If you have a higher risk for hepatitis B, you should be screened for this virus. Talk with your health care provider to find out if you are at risk forhepatitis B infection. Hepatitis C Blood testing is recommended for: Everyone born from 1945 through 1965. Anyone with known risk factors for hepatitis C. Sexually transmitted infections (STIs) You should be screened each year for STIs, including gonorrhea and chlamydia, if: You are sexually active and are younger than 53 years of age. You are older than 53 years of age   and your health care provider tells you that you are at risk for this type of infection. Your sexual activity has changed since you were last screened, and you are at increased risk for chlamydia or gonorrhea. Ask your health care provider if you are at risk. Ask your health care provider about whether you are at high risk for HIV.  Your health care provider may recommend a prescription medicine to help prevent HIV infection. If you choose to take medicine to prevent HIV, you should first get tested for HIV. You should then be tested every 3 months for as long as you are taking the medicine. Follow these instructions at home: Lifestyle Do not use any products that contain nicotine or tobacco, such as cigarettes, e-cigarettes, and chewing tobacco. If you need help quitting, ask your health care provider. Do not use street drugs. Do not share needles. Ask your health care provider for help if you need support or information about quitting drugs. Alcohol use Do not drink alcohol if your health care provider tells you not to drink. If you drink alcohol: Limit how much you have to 0-2 drinks a day. Be aware of how much alcohol is in your drink. In the [place] one drink equals one 12 oz bottle of beer (355 mL), one 5 oz glass of wine (148 mL), or one 1 oz glass of hard liquor (44 mL). General instructions Schedule regular health, dental, and eye exams. Stay current with your vaccines. Tell your health care provider if: You often feel depressed. You have ever been abused or do not feel safe at home. Summary Adopting a healthy lifestyle and getting preventive care are important in promoting health and wellness. Follow your health care provider's instructions about healthy diet, exercising, and getting tested or screened for diseases. Follow your health care provider's instructions on monitoring your cholesterol and blood pressure. This information is not intended to replace advice given to you by your health care provider. Make sure you discuss any questions you have with your healthcare provider. Document Revised: 01/05/2018 Document Reviewed: 01/05/2018 Elsevier Patient Education  2022 Elsevier Inc.  

## 2020-07-11 NOTE — Progress Notes (Signed)
Office Note 07/11/2020  CC:  Chief Complaint  Patient presents with   Annual Exam    Pt is fasting    HPI:  Mark Gilmore is a 55 y.o. White male who is here for annual health maintenance exam and f/u HTN and HLD. A/P as of last [facility] f/u visit in July 2021: "1) HLD: tolerating statin x 3 mo. FLP today.   2) HTN: stable/well controlled. Continue amlod 5mg , lisinopril 40 qd, and hctz 25. BMET today.   3) Erectile dysfunction: trial of viagra 20mg , 1-5 tabs po qd prn. Therapeutic expectations and side effect profile of medication discussed today.  Patient's questions answered."  INTERIM HX: Feeling well.  Still working in . 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  BP: home bp's consistently <130/80. Daily walking for exercise->8-10K steps. Tries to limit carbs, "hit or miss".  HLD:  out of atorva 20mg  about 2 wks.  Was tolerating this med well.  Past Medical History:  Diagnosis Date   Carotid artery dissection (HCC)    left; spontaneous   Colon cancer screening 01/2019   Cologuard NEG.  rpt 3 yrs.   History of pneumonia    History of shingles    Hypercholesterolemia    great response to atorva 2021   Hypertension    IFG (impaired fasting glucose)    108 07/2018 f/u visit.  Check A1c with NEXT f/u labs.   Obesity    OSA on CPAP    Dx'd by home sleep study 04/2014.  On auto PAP since that time; most recent compliance monitoring 07/20/14 [facility], [place], 07/22/14).  Compliance monitoring 12/2017 by Dr. [place] with [facility].   Stroke (HCC) 04/2014   difficulty with word search, tightness in R arm.  Some residual spasticity in R arm.  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    Past Surgical History:  Procedure Laterality Date   CAROTID STENT Left 08/23/2014   L carotid stent widely patent on carotid u/s by Dr. Frances Furbish  02/09/16--repeat 1 yr same 12/2017.   CHOLECYSTECTOMY  2006?   ELECTROCARDIOGRAM  04/22/2014   [facility] [place]   HOME SLEEP STUDY  04/2014   Home sleep study: mild OSA   VASECTOMY     WISDOM TOOTH EXTRACTION      Family History  Problem Relation Age of Onset   Hypertension Mother    Diabetes Mother    Stroke Mother 27       TIA's   Dementia Mother    Atrial fibrillation Mother        stent placement   Alzheimer's disease Mother    Hypertension Father    Diabetes Father    Hypertension Sister    Diabetes Sister    Thyroid disease Sister    Kidney disease Maternal Grandmother        Dialysis   CVA Maternal Grandmother    Thyroid disease Maternal Grandmother    Congestive Heart Failure Maternal Grandfather    CVA Maternal Grandfather    Deep vein thrombosis Paternal Grandmother    Asthma Daughter    Breast cancer Maternal Aunt 32    Social History   Socioeconomic History   Marital status: Married    Spouse name: Not on file   Number of children: Not on file   Years of education: Not on file   Highest education level: Not on file  Occupational History  Not on file  Tobacco Use   Smoking status: Former    Pack years: 0.00    Types: Cigarettes    Quit date: 11/17/1996    Years since quitting: 23.6   Smokeless tobacco: Former  Building services engineerVaping Use   Vaping Use: Never used  Substance and Sexual Activity   Alcohol use: No   Drug use: No   Sexual activity: Not on file  Other Topics Concern   Not on file  Social History Narrative   Married, 4 kids.   [place]   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   Tob: 5 pack yr hx, quit about 1998.   No alcohol or drugs.   Social Determinants of Health   Financial Resource Strain: Not on file  Food Insecurity: Not on file  Transportation Needs: Not on file  Physical Activity: Not on file  Stress: Not on file  Social Connections: Not on file   Intimate Partner Violence: Not on file    Outpatient Medications Prior to Visit  Medication Sig Dispense Refill   amLODipine (NORVASC) 5 MG tablet TAKE 1 TABLET BY MOUTH EVERY DAY 30 tablet 0   Ascorbic Acid (VITAMIN C PO) Take by mouth daily.     aspirin EC 81 MG tablet Take 81 mg 2 (two) times daily by mouth.     hydrochlorothiazide (HYDRODIURIL) 25 MG tablet Take 1 tablet (25 mg total) by mouth daily. 30 tablet 0   lisinopril (ZESTRIL) 40 MG tablet Take 1 tablet (40 mg total) by mouth daily. 30 tablet 0   MELATONIN ER PO Take by mouth at bedtime.     Multiple Vitamins-Minerals (MULTIVITAMIN MEN 50+ PO) Take by mouth daily.     Multiple Vitamins-Minerals (ZINC PO) Take by mouth daily.     sildenafil (REVATIO) 20 MG tablet TAKE 1 - 5 TABS BY MOUTH DAILY 30 TO 60 MINUTES PRIOR TO INTERCOURSE AS NEEDED. 90 tablet 0   VITAMIN D PO Take by mouth daily.     albuterol (VENTOLIN HFA) 108 (90 Base) MCG/ACT inhaler Inhale 2 puffs into the lungs every 4 (four) hours as needed for wheezing or shortness of breath. (Patient not taking: Reported on 07/11/2020) 1 each 0   atorvastatin (LIPITOR) 20 MG tablet Take 1 tablet (20 mg total) by mouth daily. (Patient not taking: Reported on 07/11/2020) 90 tablet 3   benzonatate (TESSALON) 200 MG capsule Take 1 capsule (200 mg total) by mouth 2 (two) times daily as needed for cough. (Patient not taking: Reported on 07/11/2020) 20 capsule 0   predniSONE (DELTASONE) 20 MG tablet 2 tabs po qd x 5d, then 1 tab po qd x 5d (Patient not taking: Reported on 07/11/2020) 15 tablet 0   No facility-administered medications prior to visit.    No Known Allergies  ROS Review of Systems  Constitutional:  Negative for appetite change, chills, fatigue and fever.  HENT:  Negative for congestion, dental problem, ear pain and sore throat.   Eyes:  Negative for discharge, redness and visual disturbance.  Respiratory:  Negative for cough, chest tightness, shortness of breath and  wheezing.   Cardiovascular:  Negative for chest pain, palpitations and leg swelling.  Gastrointestinal:  Negative for abdominal pain, blood in stool, diarrhea, nausea and vomiting.  Genitourinary:  Negative for difficulty urinating, dysuria, flank pain, frequency, hematuria and urgency.  Musculoskeletal:  Negative for arthralgias, back pain, joint swelling, myalgias and neck stiffness.  Skin:  Negative for pallor and rash.  Neurological:  Negative for dizziness, speech difficulty, weakness and headaches.  Hematological:  Negative for adenopathy. Does not bruise/bleed easily.  Psychiatric/Behavioral:  Negative for confusion and sleep disturbance. The patient is not nervous/anxious.    PE; Vitals with BMI 07/11/2020 04/19/2020 01/01/2020  Height 5\' 7"  5\' 7"  5\' 7"   Weight 210 lbs 6 oz 212 lbs 13 oz 212 lbs  BMI 32.95 33.32 33.2  Systolic 106 119  Diastolic 67 81 79  Pulse 62 74 62     Gen: Alert, well appearing.  Patient is oriented to person, place, time, and situation. AFFECT: pleasant, lucid thought and speech. ENT: Ears: EACs clear, normal epithelium.  TMs with good light reflex and landmarks bilaterally.  Eyes: no injection, icteris, swelling, or exudate.  EOMI, PERRLA. Nose: no drainage or turbinate edema/swelling.  No injection or focal lesion.  Mouth: lips without lesion/swelling.  Oral mucosa pink and moist.  Dentition intact and without obvious caries or gingival swelling.  Oropharynx without erythema, exudate, or swelling.  Neck: supple/nontender.  No LAD, mass, or TM.  Carotid pulses 2+ bilaterally, without bruits. CV: RRR, no m/r/g.   LUNGS: CTA bilat, nonlabored resps, good aeration in all lung fields. ABD: soft, NT, ND, BS normal.  No hepatospenomegaly or mass.  No bruits. EXT: no clubbing, cyanosis, or edema.  Musculoskeletal: no joint swelling, erythema, warmth, or tenderness.  ROM of all joints intact. Skin - no sores or suspicious lesions or rashes or color  changes  Pertinent labs:  Lab Results  Component Value Date   TSH 1.75 02/21/2019   Lab Results  Component Value Date   WBC 7.0 02/21/2019   HGB 16.5 02/21/2019   HCT 47.6 02/21/2019   MCV 89.7 02/21/2019   PLT 203.0 02/21/2019   Lab Results  Component Value Date   CREATININE 0.94 08/21/2019   BUN 18 08/21/2019   NA 140 08/21/2019   K 3.9 08/21/2019   CL 106 08/21/2019   CO2 26 08/21/2019   Lab Results  Component Value Date   ALT 52 06/02/2019   AST 24 06/02/2019   ALKPHOS 57 02/21/2019   BILITOT 1.9 (H) 02/21/2019   Lab Results  Component Value Date   CHOL 128 08/21/2019   Lab Results  Component Value Date   HDL 43.40 08/21/2019   Lab Results  Component Value Date   LDLCALC 54 08/21/2019   Lab Results  Component Value Date   TRIG 155.0 (H) 08/21/2019   Lab Results  Component Value Date   CHOLHDL 3 08/21/2019   Lab Results  Component Value Date   PSA 0.78 02/21/2019   PSA 0.89 02/17/2018   Lab Results  Component Value Date   HGBA1C 5.1 02/21/2019   ASSESSMENT AND PLAN:   1) HTN: stable on amlod 5mg  qd, lisinopril 40 qd, and hctz 25 qd. Lytes/cr today.  2) HLD: tolerating atorva 20 qd but out of med x 2-3 wks.  RF 'd today. FLP and hepatic panel today.  3) Health maintenance exam: Reviewed age and gender appropriate health maintenance issues (prudent diet, regular exercise, health risks of tobacco and excessive alcohol, use of seatbelts, fire alarms in home, use of sunscreen).  Also reviewed age and gender appropriate health screening as well as vaccine recommendations. Vaccines: Prevnar 20 (Carotid artery dz/hx of CVA)-->given  today..  Otherwise all UTD. Labs: HP labs+ PSA. Prostate ca screening: PSA today. Colon ca screening: cologuard neg 01/2019-->rpt 01/2022.  An After Visit Summary was printed and given to the patient.  FOLLOW UP:  Return in about 6 months (around 01/10/2021) for routine chronic illness f/u.  Signed:  Santiago Bumpers, MD            07/11/2020

## 2020-07-16 ENCOUNTER — Telehealth: Payer: Self-pay

## 2020-07-16 DIAGNOSIS — E782 Mixed hyperlipidemia: Secondary | ICD-10-CM

## 2020-07-16 MED ORDER — ATORVASTATIN CALCIUM 40 MG PO TABS: 40.0000 mg | ORAL_TABLET | Freq: Every day | ORAL | 2 refills | Status: DC

## 2020-07-16 NOTE — Telephone Encounter (Signed)
-----   Message from Jeoffrey Massed, MD sent at 07/12/2020  5:17 PM EDT ----- All labs normal except cholesterol is significantly elevated.  Needs to increase atorvastatin to 40mg  per day. Pls eRx atorvastatin 40mg , 1 tab po qd, #30, RF x 2.  Fasting lab appt in 2-3 mo for lipid panel and AST/ALT, dx is mixed hyperlipidemia.-thx

## 2020-07-16 NOTE — Telephone Encounter (Signed)
Spoke with pt regarding labs and instructions.   

## 2020-07-25 NOTE — Addendum Note (Signed)
Addended by: Paschal Dopp on: 07/25/2020 10:22 AM   Modules accepted: Orders

## 2020-10-12 ENCOUNTER — Other Ambulatory Visit: Payer: Self-pay | Admitting: Family Medicine

## 2020-11-18 ENCOUNTER — Encounter: Payer: Self-pay | Admitting: Adult Health

## 2020-12-04 ENCOUNTER — Ambulatory Visit (HOSPITAL_COMMUNITY)
Admission: RE | Admit: 2020-12-04 | Discharge: 2020-12-04 | Disposition: A | Discharge status: Home / Self Care | Payer: BC Managed Care – PPO | Source: Ambulatory Visit | Attending: Cardiology | Admitting: Cardiology

## 2020-12-04 ENCOUNTER — Other Ambulatory Visit: Payer: Self-pay

## 2020-12-04 DIAGNOSIS — I7771 Dissection of carotid artery: Secondary | ICD-10-CM | POA: Diagnosis not present

## 2020-12-04 DIAGNOSIS — G4733 Obstructive sleep apnea (adult) (pediatric): Secondary | ICD-10-CM | POA: Diagnosis not present

## 2021-01-03 DIAGNOSIS — G4733 Obstructive sleep apnea (adult) (pediatric): Secondary | ICD-10-CM | POA: Diagnosis not present

## 2021-01-07 ENCOUNTER — Telehealth: Payer: BC Managed Care – PPO | Admitting: Adult Health

## 2021-01-09 ENCOUNTER — Ambulatory Visit: Payer: BC Managed Care – PPO | Admitting: Family Medicine

## 2021-01-13 ENCOUNTER — Other Ambulatory Visit: Payer: Self-pay | Admitting: Family Medicine

## 2021-02-03 DIAGNOSIS — G4733 Obstructive sleep apnea (adult) (pediatric): Secondary | ICD-10-CM | POA: Diagnosis not present

## 2021-02-10 ENCOUNTER — Encounter: Payer: Self-pay | Admitting: *Deleted

## 2021-02-11 ENCOUNTER — Telehealth: Payer: Self-pay | Admitting: Adult Health

## 2021-02-11 ENCOUNTER — Telehealth: Payer: BC Managed Care – PPO | Admitting: Adult Health

## 2021-02-11 NOTE — Telephone Encounter (Signed)
Noted. I went ahead and canceled today's appt. Waiting on pt to call back and reschedule.

## 2021-02-11 NOTE — Telephone Encounter (Signed)
Pt left vm @ 9:29 to inform that he is unable to drop off the CPAP before his appointment.  Pt asked to be messaged either on mychart or called re: rescheduling his appointment.  Phone rep was advised by Romelle Starcher, RN to see if  he could reschedule and then bring his card before that appt?  Phone rep called and left a vm (DPR  was checked before doing so)asking pt to call back to r/s his appointment.

## 2021-04-07 ENCOUNTER — Encounter: Payer: Self-pay | Admitting: *Deleted

## 2021-04-08 ENCOUNTER — Telehealth (INDEPENDENT_AMBULATORY_CARE_PROVIDER_SITE_OTHER): Payer: BC Managed Care – PPO | Admitting: Adult Health

## 2021-04-08 DIAGNOSIS — G4733 Obstructive sleep apnea (adult) (pediatric): Secondary | ICD-10-CM

## 2021-04-08 DIAGNOSIS — Z9989 Dependence on other enabling machines and devices: Secondary | ICD-10-CM | POA: Diagnosis not present

## 2021-04-08 NOTE — Progress Notes (Signed)
? ? ? ?PATIENT: Mark Gilmore ?DOB: 1965/07/05 ? ?REASON FOR VISIT: follow up ?HISTORY FROM: patient ?PRIMARY NEUROLOGIST:  ? ?Virtual Visit via Video Note ? ?I connected with Mark Gilmore on 04/08/21 at  1:30 PM EDT by a video enabled telemedicine application located remotely at [facility] and verified that I am speaking with the correct person using two identifiers who was located at their own home. ?  ?I discussed the limitations of evaluation and management by telemedicine and the availability of in person appointments. The patient expressed understanding and agreed to proceed. ? ? ?PATIENT: Mark Gilmore ?DOB: 09-02-1965 ? ?REASON FOR VISIT: follow up ?HISTORY FROM: patient ? ?HISTORY OF PRESENT ILLNESS: ?Today 04/08/21: ? ?Mr. Salinger is a 56 year old male with a history of OSA on CPAP. Returns today for Virtual visit.  Reports that he can no longer get his report wirelessly.  The patient has had his machine since 2016.  He is interested in a new machine.  His last sleep study was done in [place]. ? ? ? ?REVIEW OF SYSTEMS: Out of a complete 14 system review of symptoms, the patient complains only of the following symptoms, and all other reviewed systems are negative. ? ?ALLERGIES: ?No Known Allergies ? ?HOME MEDICATIONS: ?Outpatient Medications Prior to Visit  ?Medication Sig Dispense Refill  ? albuterol (VENTOLIN HFA) 108 (90 Base) MCG/ACT inhaler Inhale 2 puffs into the lungs every 4 (four) hours as needed for wheezing or shortness of breath. (Patient not taking: Reported on 07/11/2020) 1 each 0  ? amLODipine (NORVASC) 5 MG tablet Take 1 tablet (5 mg total) by mouth daily. 90 tablet 3  ? Ascorbic Acid (VITAMIN C PO) Take by mouth daily.    ? aspirin EC 81 MG tablet Take 81 mg 2 (two) times daily by mouth.    ? atorvastatin (LIPITOR) 40 MG tablet TAKE 1 TABLET BY MOUTH EVERY DAY 90 tablet 0  ? hydrochlorothiazide (HYDRODIURIL) 25 MG tablet Take 1 tablet (25 mg total) by mouth daily. 90  tablet 3  ? lisinopril (ZESTRIL) 40 MG tablet Take 1 tablet (40 mg total) by mouth daily. 90 tablet 3  ? MELATONIN ER PO Take by mouth at bedtime.    ? Multiple Vitamins-Minerals (MULTIVITAMIN MEN 50+ PO) Take by mouth daily.    ? Multiple Vitamins-Minerals (ZINC PO) Take by mouth daily.    ? sildenafil (REVATIO) 20 MG tablet TAKE 1 - 5 TABS BY MOUTH DAILY 30 TO 60 MINUTES PRIOR TO INTERCOURSE AS NEEDED. 90 tablet 0  ? VITAMIN D PO Take by mouth daily.    ? ?No facility-administered medications prior to visit.  ? ? ?PAST MEDICAL HISTORY: ?Past Medical History:  ?Diagnosis Date  ? 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 Colon cancer screening 01/2019  ? Cologuard NEG.  rpt 3 yrs.  ? History of pneumonia   ? History of shingles   ? Hypercholesterolemia   ? great response to atorva 2021  ? Hypertension   ? IFG (impaired fasting glucose)   ? 108 07/2018 f/u visit.  Check A1c with NEXT f/u labs.  ? Obesity   ? OSA on CPAP   ? Dx'd by home sleep study 04/2014.  On auto PAP since that time; most recent compliance monitoring 07/20/14 [facility], [place], [place]).  Compliance monitoring 12/2017 by Dr. Frances Furbish with [facility].  ? Stroke (HCC) 04/2014  ? difficulty with word search, tightness in R arm.  Some residual spasticity in R arm.  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  ? ? ?PAST SURGICAL HISTORY: ?Past Surgical History:  ?Procedure Laterality Date  ? CAROTID STENT Left 08/23/2014  ? L carotid stent widely patent on carotid u/s by Dr. Allyson Sabal 02/09/16--repeat 1 yr same 12/2017.  ? CHOLECYSTECTOMY  2006?  ? ELECTROCARDIOGRAM  04/22/2014  ? [facility]  ? HOME SLEEP STUDY  04/2014  ? Home sleep study: mild OSA  ? VASECTOMY    ? WISDOM TOOTH EXTRACTION    ? ? ?FAMILY HISTORY: ?Family History  ?Problem Relation Age of Onset  ? Hypertension Mother   ? Diabetes Mother   ? Stroke Mother 40  ?     TIA's  ?  Dementia Mother   ? Atrial fibrillation Mother   ?     stent placement  ? Alzheimer's disease Mother   ? Hypertension Father   ? Diabetes Father   ? Hypertension Sister   ? Diabetes Sister   ? Thyroid disease Sister   ? Kidney disease Maternal Grandmother   ?     Dialysis  ? CVA Maternal Grandmother   ? Thyroid disease Maternal Grandmother   ? Congestive Heart Failure Maternal Grandfather   ? CVA Maternal Grandfather   ? Deep vein thrombosis Paternal Grandmother   ? Asthma Daughter   ? Breast cancer Maternal Aunt 78  ? ? ?SOCIAL HISTORY: ?Social History  ? ?Socioeconomic History  ? Marital status: Married  ?  Spouse name: Not on file  ? Number of children: Not on file  ? Years of education: Not on file  ? Highest education level: Not on file  ?Occupational History  ? Not on file  ?Tobacco Use  ? Smoking status: Former  ?  Types: Cigarettes  ?  Quit date: 11/17/1996  ?  Years since quitting: 24.4  ? Smokeless tobacco: Former  ?Vaping Use  ? Vaping Use: Never used  ?Substance and Sexual Activity  ? Alcohol use: No  ? Drug use: No  ? Sexual activity: Not on file  ?Other Topics Concern  ? Not on file  ?Social History Narrative  ? Married, 4 kids.  ? Moved from [place] to [facility] 08/2016.  ? Educ: masters degree  ? Occup: Corporate investment banker.  ? Tob: 5 pack yr hx, quit about 1998.  ? No alcohol or drugs.  ? ?Social Determinants of Health  ? ?Financial Resource Strain: Not on file  ?Food Insecurity: Not on file  ?Transportation Needs: Not on file  ?Physical Activity: Not on file  ?Stress: Not on file  ?Social Connections: Not on file  ?Intimate Partner Violence: Not on file  ? ? ? ? ?PHYSICAL EXAM ?Generalized: Well developed, in no acute distress  ? ?Neurological examination  ?Mentation: Alert oriented to time, place, history taking. Follows all commands speech and language fluent ?Cranial nerve II-XII:Extraocular movements were full. Facial symmetry noted. uvula tongue midline. Head turning and  shoulder shrug  were normal and symmetric. ?Motor: Good strength throughout subjectively per patient ?Sensory: Sensory testing is intact to soft touch on all 4 extremities subjectively per patient ?Coordination: Cerebellar testing reveals good finger-nose-finger  ?Gait and station: Patient is able to stand from a seated position. gait is normal.  ?Reflexes: UTA ? ?DIAGNOSTIC DATA (LABS, IMAGING, TESTING) ?- I reviewed patient records, labs, notes, testing and imaging myself where available. ? ?Lab Results  ?Component Value  Date  ? WBC 6.6 07/11/2020  ? HGB 16.4 07/11/2020  ? HCT 46.8 07/11/2020  ? MCV 89.9 07/11/2020  ? PLT 213.0 07/11/2020  ? ?   ?Component Value Date/Time  ? NA 140 07/11/2020 0928  ? K 3.7 07/11/2020 0928  ? CL 104 07/11/2020 0928  ? CO2 26 07/11/2020 0928  ? GLUCOSE 90 07/11/2020 0928  ? BUN 17 07/11/2020 0928  ? CREATININE 0.92 07/11/2020 0928  ? CALCIUM 9.9 07/11/2020 0928  ? PROT 7.2 07/11/2020 0928  ? ALBUMIN 4.8 07/11/2020 0928  ? AST 24 07/11/2020 0928  ? ALT 47 07/11/2020 0928  ? ALKPHOS 58 07/11/2020 0928  ? BILITOT 2.0 (H) 07/11/2020 [id number]  ? ?Lab Results  ?Component Value Date  ? CHOL 207 (H) 07/11/2020  ? HDL 48.80 07/11/2020  ? LDLCALC 54 08/21/2019  ? LDLDIRECT 140.0 07/11/2020  ? TRIG 202.0 (H) 07/11/2020  ? CHOLHDL 4 07/11/2020  ? ?Lab Results  ?Component Value Date  ? HGBA1C 5.1 02/21/2019  ? ?No results found for: VITAMINB12 ?Lab Results  ?Component Value Date  ? TSH 1.70 07/11/2020  ? ? ? ? ?ASSESSMENT AND PLAN ?56 y.o. year old male  has a past medical history of Carotid artery dissection (HCC), Colon cancer screening (01/2019), History of pneumonia, History of shingles, Hypercholesterolemia, Hypertension, IFG (impaired fasting glucose), Obesity, OSA on CPAP, and Stroke (HCC) (04/2014). here with: ? ?OSA on CPAP ? ?CPAP compliance excellent ?Residual AHI is good ?Encouraged patient to continue using CPAP nightly and > 4 hours each night ?Home sleep test ordered pending results  we will order a new machine ?F/U in 1 year or sooner if needed ? ? ? ?Butch PennyMegan Arkel Cartwright, MSN, NP-C 04/08/2021, 1:19 PM ?[name] ?[address], [address] ?[place], [place] [address] ?[phone]

## 2021-04-13 ENCOUNTER — Other Ambulatory Visit: Payer: Self-pay | Admitting: Family Medicine

## 2021-04-28 ENCOUNTER — Telehealth: Payer: Self-pay

## 2021-04-28 NOTE — Telephone Encounter (Signed)
LVM for pt to call me back to schedule sleep study  

## 2021-05-19 ENCOUNTER — Ambulatory Visit (INDEPENDENT_AMBULATORY_CARE_PROVIDER_SITE_OTHER): Payer: BC Managed Care – PPO | Admitting: Neurology

## 2021-05-19 DIAGNOSIS — G4733 Obstructive sleep apnea (adult) (pediatric): Secondary | ICD-10-CM | POA: Diagnosis not present

## 2021-05-22 NOTE — Progress Notes (Signed)
See procedure note.

## 2021-05-22 NOTE — Procedures (Signed)
? ?  [name] ? ?HOME SLEEP TEST (Watch PAT) REPORT ? ?STUDY DATE: 05/19/2021 ? ?DOB: Nov 20, 1965 ? ?MRN: [id number] ? ?ORDERING CLINICIAN: Star Age, MD, PhD ?  ?REFERRING CLINICIAN: Ward Givens, NP ? ?CLINICAL INFORMATION/HISTORY: 56 year old gentleman with an underlying medical history of hyperlipidemia, hypertension, history of carotid artery dissection, history of stroke, and obesity, who was previously diagnosed with sleep apnea and has been on AutoPap therapy.  He presents for reevaluation, he has an older machine.   ? ?BMI: 32.9 kg/m? ? ?FINDINGS:  ? ?Sleep Summary:  ? ?Total Recording Time (hours, min): 7 hours, 56 minutes ? ?Total Sleep Time (hours, min):  7 hours, 11 minutes  ? ?Percent REM (%):    32%  ? ?Respiratory Indices:  ? ?Calculated pAHI (per hour):  15.9/hour        ? ?REM pAHI:    24.4/hour      ? ?NREM pAHI: 11.9/hour ? ?Oxygen Saturation Statistics:  ?  ?Oxygen Saturation (%) Mean: 94%  ? ?Minimum oxygen saturation (%):                 85%  ? ?O2 Saturation Range (%): 85-99%   ? ?O2 Saturation (minutes) <=88%: 0.6 min ? ?Pulse Rate Statistics:  ? ?Pulse Mean (bpm):    59/min   ? ?Pulse Range (46-90/min)  ? ?IMPRESSION: OSA (obstructive sleep apnea)  ? ?RECOMMENDATION:  ?This home sleep test demonstrates moderate obstructive sleep apnea with a total AHI of 15.9/hour and O2 nadir of 85%.  Intermittent mild to moderate snoring was detected.  Ongoing treatment with positive airway pressure is recommended. The patient will be advised to start treatment with a new AutoPAP machine.  Pressure settings can be kept the same. A full night titration study may be considered to optimize treatment settings, if needed down the road. Please note that untreated obstructive sleep apnea may carry additional perioperative morbidity. Patients with significant obstructive sleep apnea should receive perioperative PAP therapy and the surgeons and particularly the anesthesiologist should be  informed of the diagnosis and the severity of the sleep disordered breathing. ?The patient should be cautioned not to drive, work at heights, or operate dangerous or heavy equipment when tired or sleepy. Review and reiteration of good sleep hygiene measures should be pursued with any patient. ?Other causes of the patient's symptoms, including circadian rhythm disturbances, an underlying mood disorder, medication effect and/or an underlying medical problem cannot be ruled out based on this test. Clinical correlation is recommended. The patient and his referring provider will be notified of the test results. The patient will be seen in follow up in sleep clinic at [facility]. ? ?I certify that I have reviewed the raw data recording prior to the issuance of this report in accordance with the standards of the American Academy of Sleep Medicine (AASM). ? ?INTERPRETING PHYSICIAN:  ? ?Star Age, MD, PhD  ?Board Certified in Neurology and Sleep Medicine ? ?[name] ?[address], [address] ?[place], [place] [address] ?[phone] ? ? ? ? ? ? ? ? ? ? ? ? ? ? ? ? ?

## 2021-05-27 ENCOUNTER — Telehealth: Payer: Self-pay | Admitting: *Deleted

## 2021-05-27 NOTE — Telephone Encounter (Signed)
Order for new autoPAP machine sent to [facility].  ?

## 2021-05-27 NOTE — Addendum Note (Signed)
Addended by: Enedina Finner on: 05/27/2021 08:40 AM ? ? Modules accepted: Orders ? ?

## 2021-05-28 NOTE — Telephone Encounter (Signed)
Received fax that [facility] asking for latest office note.  I faxed to them last ofv note, sleep study, signed order with pressure settings, and demographic info.  Received confirmation received by fax.  ?

## 2021-05-30 ENCOUNTER — Ambulatory Visit (INDEPENDENT_AMBULATORY_CARE_PROVIDER_SITE_OTHER): Payer: BC Managed Care – PPO | Admitting: Family Medicine

## 2021-05-30 ENCOUNTER — Encounter: Payer: Self-pay | Admitting: Family Medicine

## 2021-05-30 VITALS — BP 130/78 | HR 96 | Temp 97.8°F | Ht 67.0 in | Wt 215.8 lb

## 2021-05-30 DIAGNOSIS — E782 Mixed hyperlipidemia: Secondary | ICD-10-CM | POA: Diagnosis not present

## 2021-05-30 DIAGNOSIS — I10 Essential (primary) hypertension: Secondary | ICD-10-CM

## 2021-05-30 MED ORDER — LISINOPRIL 40 MG PO TABS: 40.0000 mg | ORAL_TABLET | Freq: Every day | ORAL | 3 refills | Status: DC

## 2021-05-30 MED ORDER — HYDROCHLOROTHIAZIDE 25 MG PO TABS: 25.0000 mg | ORAL_TABLET | Freq: Every day | ORAL | 3 refills | Status: DC

## 2021-05-30 MED ORDER — ATORVASTATIN CALCIUM 40 MG PO TABS: 40.0000 mg | ORAL_TABLET | Freq: Every day | ORAL | 3 refills | Status: DC

## 2021-05-30 MED ORDER — AMLODIPINE BESYLATE 5 MG PO TABS: 5.0000 mg | ORAL_TABLET | Freq: Every day | ORAL | 3 refills | Status: DC

## 2021-05-30 NOTE — Progress Notes (Signed)
OFFICE VISIT ? ?05/30/2021 ? ?CC:  ?Chief Complaint  ?Patient presents with  ? Hypertension  ? Hyperlipidemia  ?  Pt is not fasting  ? ? ?Patient is a 56 y.o. male who presents for f/u HTN and HLD. ?A/P as of last visit on 07/11/20: ?"1) HTN: stable on amlod 5mg  qd, lisinopril 40 qd, and hctz 25 qd. ?Lytes/cr today. ?  ?2) HLD: tolerating atorva 20 qd but out of med x 2-3 wks.  RF 'd today. ?FLP and hepatic panel today. ?  ?3) Health maintenance exam: ?Reviewed age and gender appropriate health maintenance issues (prudent diet, regular exercise, health risks of tobacco and excessive alcohol, use of seatbelts, fire alarms in home, use of sunscreen).  Also reviewed age and gender appropriate health screening as well as vaccine recommendations. ?Vaccines: Prevnar 20 (Carotid artery dz/hx of CVA)-->given  today..  Otherwise all UTD. ?Labs: HP labs+ PSA. ?Prostate ca screening: PSA today. ?Colon ca screening: cologuard neg 01/2019-->rpt 01/2022." ? ?INTERIM HX: ?02/2022 feels well. ?Has been traveling quite a lot for his [place], [place]. ?Had not had time to exercise much at all. ?Has not monitored blood pressure. ? ?He has been taking all his medications as prescribed. ? ?ROS: no fevers, no CP, no SOB, no wheezing, no cough, no dizziness, no HAs, no rashes, no melena/hematochezia.  No polyuria or polydipsia.  No myalgias or arthralgias.  No focal weakness, paresthesias, or tremors.  No acute vision or hearing abnormalities.  No dysuria or unusual/new urinary urgency or frequency.  No recent changes in lower legs. ?No n/v/d or abd pain.  No palpitations.   ? ?Past Medical History:  ?Diagnosis Date  ? 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 Colon cancer screening 01/2019  ? Cologuard NEG.  rpt 3 yrs.  ? History of pneumonia   ? History of shingles   ? Hypercholesterolemia   ? great response to atorva 2021  ? Hypertension   ? IFG (impaired fasting glucose)   ? 108 07/2018 f/u visit.  Check A1c with  NEXT f/u labs.  ? Obesity   ? OSA on CPAP   ? Dx'd by home sleep study 04/2014.  On auto PAP since that time; most recent compliance monitoring 07/20/14 [facility] [place] [place]).  Compliance monitoring 12/2017 by Dr. 01/2018 with [facility].  ? Stroke (HCC) 04/2014  ? difficulty with word search, tightness in R arm.  Some residual spasticity in R arm.  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  ? ? ?Past Surgical History:  ?Procedure Laterality Date  ? CAROTID STENT Left 08/23/2014  ? L carotid stent widely patent on carotid u/s by Dr. 08/25/2014 02/09/16--repeat 1 yr same 12/2017.  ? CHOLECYSTECTOMY  2006?  ? ELECTROCARDIOGRAM  04/22/2014  ? [facility] [place]  ? HOME SLEEP STUDY  04/2014  ? Home sleep study: mild OSA  ? VASECTOMY    ? WISDOM TOOTH EXTRACTION    ? ? ?Outpatient Medications Prior to Visit  ?Medication Sig Dispense Refill  ? Ascorbic Acid (VITAMIN C PO) Take by mouth daily.    ? aspirin EC 81 MG tablet Take 81 mg 2 (two) times daily by mouth.    ? MELATONIN ER PO Take by mouth at bedtime.    ? Multiple Vitamins-Minerals (MULTIVITAMIN MEN 50+ PO) Take by mouth daily.    ? Multiple Vitamins-Minerals (ZINC PO) Take by mouth daily.    ?  sildenafil (REVATIO) 20 MG tablet TAKE 1 - 5 TABS BY MOUTH DAILY 30 TO 60 MINUTES PRIOR TO INTERCOURSE AS NEEDED. 90 tablet 0  ? VITAMIN D PO Take by mouth daily.    ? albuterol (VENTOLIN HFA) 108 (90 Base) MCG/ACT inhaler Inhale 2 puffs into the lungs every 4 (four) hours as needed for wheezing or shortness of breath. (Patient not taking: Reported on 07/11/2020) 1 each 0  ? amLODipine (NORVASC) 5 MG tablet Take 1 tablet (5 mg total) by mouth daily. 90 tablet 3  ? atorvastatin (LIPITOR) 40 MG tablet TAKE 1 TABLET BY MOUTH EVERY DAY 90 tablet 0  ? hydrochlorothiazide (HYDRODIURIL) 25 MG tablet Take 1 tablet (25 mg total) by mouth daily. 90 tablet 3  ? lisinopril (ZESTRIL) 40 MG tablet  Take 1 tablet (40 mg total) by mouth daily. 90 tablet 3  ? ?No facility-administered medications prior to visit.  ? ? ?No Known Allergies ? ?ROS ?As per HPI ? ?PE: ? ?  05/30/2021  ?  1:44 PM 07/11/2020  ?  9:07 AM 04/19/2020  ?  3:52 PM  ?Vitals with BMI  ?Height 5\' 7"  5\' 7"  5\' 7"   ?Weight 215 lbs 13 oz 210 lbs 6 oz 212 lbs 13 oz  ?BMI 33.79 32.95 33.32  ?Systolic 130 106  ?Diastolic 78 67 81  ?Pulse 96 62 74  ? ? ? ?Physical Exam ? ?Gen: Alert, well appearing.  Patient is oriented to person, place, time, and situation. ?AFFECT: pleasant, lucid thought and speech. ?CV: RRR, no m/r/g.   ?LUNGS: CTA bilat, nonlabored resps, good aeration in all lung fields. ?EXT: no clubbing or cyanosis.  no edema.  ? ? ?LABS:  ?Last CBC ?Lab Results  ?Component Value Date  ? WBC 6.6 07/11/2020  ? HGB 16.4 07/11/2020  ? HCT 46.8 07/11/2020  ? MCV 89.9 07/11/2020  ? RDW 13.3 07/11/2020  ? PLT 213.0 07/11/2020  ? ?Last metabolic panel ?Lab Results  ?Component Value Date  ? GLUCOSE 90 07/11/2020  ? NA 140 07/11/2020  ? K 3.7 07/11/2020  ? CL 104 07/11/2020  ? CO2 26 07/11/2020  ? BUN 17 07/11/2020  ? CREATININE 0.92 07/11/2020  ? CALCIUM 9.9 07/11/2020  ? PROT 7.2 07/11/2020  ? ALBUMIN 4.8 07/11/2020  ? BILITOT 2.0 (H) 07/11/2020  ? ALKPHOS 58 07/11/2020  ? AST 24 07/11/2020  ? ALT 47 07/11/2020  ? ?Last lipids ?Lab Results  ?Component Value Date  ? CHOL 207 (H) 07/11/2020  ? HDL 48.80 07/11/2020  ? LDLCALC 54 08/21/2019  ? LDLDIRECT 140.0 07/11/2020  ? TRIG 202.0 (H) 07/11/2020  ? CHOLHDL 4 07/11/2020  ? ?Last hemoglobin A1c ?Lab Results  ?Component Value Date  ? HGBA1C 5.1 02/21/2019  ? ?Last thyroid functions ?Lab Results  ?Component Value Date  ? TSH 1.70 07/11/2020  ? ?Lab Results  ?Component Value Date  ? HGBA1C 5.1 02/21/2019  ? ?IMPRESSION AND PLAN: ? ?#1 hypertension, well controlled on amlodipine 5 mg a day, hydrochlorothiazide 25 mg a day, and lisinopril 40 mg a day. ?He will return for fasting lecture lites and  creatinine. ? ?#2 mixed hyperlipidemia.  Doing well on atorvastatin 40 mg a day. ?He will return for fasting lipids and hepatic panel ? ?An After Visit Summary was printed and given to the patient. ? ?FOLLOW UP: Return in about 6 months (around 11/30/2021) for annual CPE (fasting). ? ?Signed:  07/13/2020, MD  05/30/2021 ? ? ? ? ?

## 2021-06-09 ENCOUNTER — Ambulatory Visit (INDEPENDENT_AMBULATORY_CARE_PROVIDER_SITE_OTHER): Payer: BC Managed Care – PPO

## 2021-06-09 DIAGNOSIS — E782 Mixed hyperlipidemia: Secondary | ICD-10-CM | POA: Diagnosis not present

## 2021-06-09 DIAGNOSIS — I10 Essential (primary) hypertension: Secondary | ICD-10-CM | POA: Diagnosis not present

## 2021-06-10 LAB — COMPREHENSIVE METABOLIC PANEL
ALT: 51 U/L (ref 0–53)
AST: 24 U/L (ref 0–37)
Albumin: 4.5 g/dL (ref 3.5–5.2)
Alkaline Phosphatase: 60 U/L (ref 39–117)
BUN: 19 mg/dL (ref 6–23)
CO2: 24 mEq/L (ref 19–32)
Calcium: 9.3 mg/dL (ref 8.4–10.5)
Chloride: 109 mEq/L (ref 96–112)
Creatinine, Ser: 0.91 mg/dL (ref 0.40–1.50)
GFR: 95.96 mL/min (ref >60.00)
Glucose, Bld: 107 mg/dL — ABNORMAL HIGH (ref 70–99)
Potassium: 3.7 mEq/L (ref 3.5–5.1)
Sodium: 144 mEq/L (ref 135–145)
Total Bilirubin: 1.5 mg/dL — ABNORMAL HIGH (ref 0.2–1.2)
Total Protein: 6.7 g/dL (ref 6.0–8.3)

## 2021-06-10 LAB — LIPID PANEL
Cholesterol: 178 mg/dL (ref 0–200)
HDL: 44 mg/dL (ref >39.00)
LDL Cholesterol: 104 mg/dL — ABNORMAL HIGH (ref 0–99)
NonHDL: 134.35
Total CHOL/HDL Ratio: 4
Triglycerides: 153 mg/dL — ABNORMAL HIGH (ref 0.0–149.0)
VLDL: 30.6 mg/dL (ref 0.0–40.0)

## 2021-06-12 MED ORDER — ATORVASTATIN CALCIUM 80 MG PO TABS: 80.0000 mg | ORAL_TABLET | Freq: Every day | ORAL | 2 refills | Status: DC

## 2021-06-12 NOTE — Addendum Note (Signed)
Addended by: Paschal Dopp on: 06/12/2021 08:42 AM   Modules accepted: Orders

## 2021-07-03 DIAGNOSIS — G4733 Obstructive sleep apnea (adult) (pediatric): Secondary | ICD-10-CM | POA: Diagnosis not present

## 2021-07-04 ENCOUNTER — Telehealth: Payer: Self-pay | Admitting: Adult Health

## 2021-07-04 NOTE — Telephone Encounter (Signed)
Pt was scheduled for Initial CPAP visit on 09/17/21 Pt was informed to bring machine and power cord to appt.  DME: [facility] Phone: [phone] Fax: [phone] Equipment issued: AirSense 11 on 07/03/21 Pt to be scheduled between: 08/03/21-10/01/21

## 2021-07-21 ENCOUNTER — Emergency Department (INDEPENDENT_AMBULATORY_CARE_PROVIDER_SITE_OTHER): Payer: BC Managed Care – PPO

## 2021-07-21 ENCOUNTER — Emergency Department (INDEPENDENT_AMBULATORY_CARE_PROVIDER_SITE_OTHER)
Admission: EM | Admit: 2021-07-21 | Discharge: 2021-07-21 | Disposition: A | Discharge status: Home / Self Care | Payer: BC Managed Care – PPO | Source: Home / Self Care

## 2021-07-21 DIAGNOSIS — R059 Cough, unspecified: Secondary | ICD-10-CM

## 2021-07-21 DIAGNOSIS — R0602 Shortness of breath: Secondary | ICD-10-CM

## 2021-07-21 MED ORDER — PROMETHAZINE-DM 6.25-15 MG/5ML PO SYRP: 5.0000 mL | ORAL_SOLUTION | Freq: Two times a day (BID) | ORAL | 0 refills | Status: DC | PRN

## 2021-07-21 MED ORDER — BENZONATATE 200 MG PO CAPS: 200.0000 mg | ORAL_CAPSULE | Freq: Three times a day (TID) | ORAL | 0 refills | Status: AC | PRN

## 2021-07-21 MED ORDER — AMOXICILLIN-POT CLAVULANATE 875-125 MG PO TABS: 1.0000 | ORAL_TABLET | Freq: Two times a day (BID) | ORAL | 0 refills | Status: AC

## 2021-08-02 DIAGNOSIS — G4733 Obstructive sleep apnea (adult) (pediatric): Secondary | ICD-10-CM | POA: Diagnosis not present

## 2021-08-25 ENCOUNTER — Other Ambulatory Visit (INDEPENDENT_AMBULATORY_CARE_PROVIDER_SITE_OTHER): Payer: BC Managed Care – PPO

## 2021-08-25 ENCOUNTER — Ambulatory Visit: Payer: BC Managed Care – PPO

## 2021-08-25 ENCOUNTER — Other Ambulatory Visit: Payer: Self-pay | Admitting: Family Medicine

## 2021-08-25 DIAGNOSIS — E782 Mixed hyperlipidemia: Secondary | ICD-10-CM

## 2021-08-25 LAB — LIPID PANEL
Cholesterol: 132 mg/dL (ref 0–200)
HDL: 44.2 mg/dL (ref >39.00)
LDL Cholesterol: 62 mg/dL (ref 0–99)
NonHDL: 88.17
Total CHOL/HDL Ratio: 3
Triglycerides: 129 mg/dL (ref 0.0–149.0)
VLDL: 25.8 mg/dL (ref 0.0–40.0)

## 2021-09-02 DIAGNOSIS — G4733 Obstructive sleep apnea (adult) (pediatric): Secondary | ICD-10-CM | POA: Diagnosis not present

## 2021-09-16 NOTE — Progress Notes (Unsigned)
PATIENT: Mark Gilmore DOB: [birth year]  REASON FOR VISIT: follow up HISTORY FROM: patient PRIMARY NEUROLOGIST: Dr. Frances Furbish  Chief Complaint  Patient presents with   Follow-up    Pt in 5  Pt here for CPAP f/u Pt states no questions or concerns for  todays visit      HISTORY OF PRESENT ILLNESS: Today 09/17/21: Mark Gilmore is a 56 year old male with a history of obstructive sleep apnea on CPAP.  He returns today for follow-up. Reports that he liked his other machine better-feels like the settings are different with the new machine.  On comparison new machine has EPR 3 whereas on other machine EPR was turned off.  Otherwise he feels that the CPAP works well.  He was sick when he initially started the CPAP and had a cough.  Otherwise he is doing better now.    REVIEW OF SYSTEMS: Out of a complete 14 system review of symptoms, the patient complains only of the following symptoms, and all other reviewed systems are negative.   ESS 3   ALLERGIES: No Known Allergies  HOME MEDICATIONS: Outpatient Medications Prior to Visit  Medication Sig Dispense Refill   amLODipine (NORVASC) 5 MG tablet Take 1 tablet (5 mg total) by mouth daily. 90 tablet 3   Ascorbic Acid (VITAMIN C PO) Take by mouth daily.     aspirin EC 81 MG tablet Take 81 mg 2 (two) times daily by mouth.     atorvastatin (LIPITOR) 80 MG tablet TAKE 1 TABLET BY MOUTH EVERY DAY 90 tablet 3   hydrochlorothiazide (HYDRODIURIL) 25 MG tablet Take 1 tablet (25 mg total) by mouth daily. 90 tablet 3   lisinopril (ZESTRIL) 40 MG tablet Take 1 tablet (40 mg total) by mouth daily. 90 tablet 3   MELATONIN ER PO Take by mouth at bedtime.     Multiple Vitamins-Minerals (MULTIVITAMIN MEN 50+ PO) Take by mouth daily.     Multiple Vitamins-Minerals (ZINC PO) Take by mouth daily.     promethazine-dextromethorphan (PROMETHAZINE-DM) 6.25-15 MG/5ML syrup Take 5 mLs by mouth 2 (two) times daily as needed for cough. 118 mL 0   sildenafil (REVATIO)  20 MG tablet TAKE 1 - 5 TABS BY MOUTH DAILY 30 TO 60 MINUTES PRIOR TO INTERCOURSE AS NEEDED. 90 tablet 0   VITAMIN D PO Take by mouth daily.     No facility-administered medications prior to visit.    PAST MEDICAL HISTORY: Past Medical History:  Diagnosis Date   Carotid artery dissection (HCC)    left; spontaneous   Colon cancer screening 01/2019   Cologuard NEG.  rpt 3 yrs.   History of pneumonia    History of shingles    Hypercholesterolemia    great response to atorva 2021   Hypertension    IFG (impaired fasting glucose)    108 07/2018 f/u visit.  Check A1c with NEXT f/u labs.   Obesity    OSA on CPAP    Dx'd by home sleep study 04/2014.  On auto PAP since that time; most recent compliance monitoring 07/20/14 [facility], [place], [place]).  Compliance monitoring 12/2017 by Dr. Frances Furbish with [facility].   Stroke (HCC) 04/2014   difficulty with word search, tightness in R arm.  Some residual spasticity in R arm.  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    PAST SURGICAL HISTORY: Past Surgical History:  Procedure Laterality Date   CAROTID STENT Left 08/23/2014  L carotid stent widely patent on carotid u/s by Dr. Allyson Sabal 02/09/16--repeat 1 yr same 12/2017.   CHOLECYSTECTOMY  2006?   ELECTROCARDIOGRAM  04/22/2014   [facility]   HOME SLEEP STUDY  04/2014   Home sleep study: mild OSA   VASECTOMY     WISDOM TOOTH EXTRACTION      FAMILY HISTORY: Family History  Problem Relation Age of Onset   Hypertension Mother    Diabetes Mother    Stroke Mother 12       TIA's   Dementia Mother    Atrial fibrillation Mother        stent placement   Alzheimer's disease Mother    Sleep apnea Mother    Hypertension Father    Diabetes Father    Hypertension Sister    Diabetes Sister    Thyroid disease Sister    Sleep apnea Sister    Breast cancer Maternal Aunt 36   Kidney disease  Maternal Grandmother        Dialysis   CVA Maternal Grandmother    Thyroid disease Maternal Grandmother    Congestive Heart Failure Maternal Grandfather    CVA Maternal Grandfather    Deep vein thrombosis Paternal Grandmother    Asthma Daughter     SOCIAL HISTORY: Social History   Socioeconomic History   Marital status: Married    Spouse name: Not on file   Number of children: Not on file   Years of education: Not on file   Highest education level: Not on file  Occupational History   Not on file  Tobacco Use   Smoking status: Former    Types: Cigarettes    Quit date: 11/17/1996    Years since quitting: 24.8   Smokeless tobacco: Former  Building services engineer Use: Never used  Substance and Sexual Activity   Alcohol use: No   Drug use: No   Sexual activity: Not on file  Other Topics Concern   Not on file  Social History Narrative   Married, 4 kids.   Moved from [place] to [facility] 08/2016.   Educ: masters degree   Occup: Corporate investment banker.   Tob: 5 pack yr hx, quit about 1998.   No alcohol or drugs.   Social Determinants of Health   Financial Resource Strain: Not on file  Food Insecurity: Not on file  Transportation Needs: Not on file  Physical Activity: Not on file  Stress: Not on file  Social Connections: Not on file  Intimate Partner Violence: Not on file      PHYSICAL EXAM  Vitals:   09/17/21 1126  BP: 114/70  Pulse: 74  Weight: 217 lb (98.4 kg)  Height: 5\' 7"  (1.702 m)   Body mass index is 33.99 kg/m.  Generalized: Well developed, in no acute distress  Chest: Lungs clear to auscultation bilaterally  Neurological examination  Mentation: Alert oriented to time, place, history taking. Follows all commands speech and language fluent Cranial nerve II-XII: Extraocular movements were full, visual field were full on confrontational test Head turning and shoulder shrug  were normal and symmetric. Motor: The motor testing  reveals 5 over 5 strength of all 4 extremities. Good symmetric motor tone is noted throughout.  Sensory: Sensory testing is intact to soft touch on all 4 extremities. No evidence of extinction is noted.  Gait and station: Gait is normal.    DIAGNOSTIC DATA (LABS, IMAGING, TESTING) - I reviewed patient records,  labs, notes, testing and imaging myself where available.  Lab Results  Component Value Date   WBC 6.6 07/11/2020   HGB 16.4 07/11/2020   HCT 46.8 07/11/2020   MCV 89.9 07/11/2020   PLT 213.0 07/11/2020      Component Value Date/Time   NA 144 06/09/2021 0819   K 3.7 06/09/2021 0819   CL 109 06/09/2021 0819   CO2 24 06/09/2021 0819   GLUCOSE 107 (H) 06/09/2021 0819   BUN 19 06/09/2021 0819   CREATININE 0.91 06/09/2021 0819   CALCIUM 9.3 06/09/2021 0819   PROT 6.7 06/09/2021 0819   ALBUMIN 4.5 06/09/2021 0819   AST 24 06/09/2021 0819   ALT 51 06/09/2021 0819   ALKPHOS 60 06/09/2021 0819   BILITOT 1.5 (H) 06/09/2021 0819   Lab Results  Component Value Date   CHOL 132 08/25/2021   HDL 44.20 08/25/2021   LDLCALC 62 08/25/2021   LDLDIRECT 140.0 07/11/2020   TRIG 129.0 08/25/2021   CHOLHDL 3 08/25/2021   Lab Results  Component Value Date   HGBA1C 5.1 02/21/2019   No results found for: "VITAMINB12" Lab Results  Component Value Date   TSH 1.70 07/11/2020      ASSESSMENT AND PLAN 56 y.o. year old male  has a past medical history of Carotid artery dissection (HCC), Colon cancer screening (01/2019), History of pneumonia, History of shingles, Hypercholesterolemia, Hypertension, IFG (impaired fasting glucose), Obesity, OSA on CPAP, and Stroke (HCC) (04/2014). here with:  OSA on CPAP  - CPAP compliance excellent - Good treatment of AHI  - Order sent to turn off EPR he will let me know if this is better - Encourage patient to use CPAP nightly and > 4 hours each night - F/U in 1 year or sooner if needed     Butch Penny, MSN, NP-C 09/17/2021, 11:32  AM [facility] [address] [place], [place] [address] [phone]

## 2021-09-17 ENCOUNTER — Encounter: Payer: Self-pay | Admitting: Adult Health

## 2021-09-17 ENCOUNTER — Ambulatory Visit (INDEPENDENT_AMBULATORY_CARE_PROVIDER_SITE_OTHER): Payer: BC Managed Care – PPO | Admitting: Adult Health

## 2021-09-17 VITALS — BP 114/70 | HR 74 | Ht 67.0 in | Wt 217.0 lb

## 2021-09-17 DIAGNOSIS — Z9989 Dependence on other enabling machines and devices: Secondary | ICD-10-CM

## 2021-09-17 DIAGNOSIS — G4733 Obstructive sleep apnea (adult) (pediatric): Secondary | ICD-10-CM

## 2021-09-22 NOTE — Progress Notes (Signed)
Message Received: Today Hepler, Joseph Pierini, RN Got it      Previous Messages    ----- Message -----  From: Guy Begin, RN  Sent: 09/17/2021  12:52 PM EDT  To: Mellody Dance; Billee Cashing   New order in Epic turn off EPR   Shaul Trautman "Alinda Money"  Male, 56 y.o., [birth year]  MRN:  [id number]  [facility] RN

## 2021-10-03 DIAGNOSIS — G4733 Obstructive sleep apnea (adult) (pediatric): Secondary | ICD-10-CM | POA: Diagnosis not present

## 2021-11-03 ENCOUNTER — Other Ambulatory Visit: Payer: Self-pay | Admitting: Family Medicine

## 2021-11-04 MED ORDER — SILDENAFIL CITRATE 20 MG PO TABS: ORAL_TABLET | ORAL | 0 refills | Status: DC

## 2021-11-20 ENCOUNTER — Other Ambulatory Visit: Payer: Self-pay | Admitting: Family Medicine

## 2021-11-20 DIAGNOSIS — G4733 Obstructive sleep apnea (adult) (pediatric): Secondary | ICD-10-CM | POA: Diagnosis not present

## 2021-11-20 MED ORDER — SILDENAFIL CITRATE 20 MG PO TABS: ORAL_TABLET | ORAL | 0 refills | Status: AC

## 2021-12-21 DIAGNOSIS — G4733 Obstructive sleep apnea (adult) (pediatric): Secondary | ICD-10-CM | POA: Diagnosis not present

## 2022-01-20 DIAGNOSIS — G4733 Obstructive sleep apnea (adult) (pediatric): Secondary | ICD-10-CM | POA: Diagnosis not present

## 2022-03-30 DIAGNOSIS — G4733 Obstructive sleep apnea (adult) (pediatric): Secondary | ICD-10-CM | POA: Diagnosis not present

## 2022-04-13 ENCOUNTER — Encounter: Payer: Self-pay | Admitting: *Deleted

## 2022-04-14 ENCOUNTER — Telehealth (INDEPENDENT_AMBULATORY_CARE_PROVIDER_SITE_OTHER): Payer: BC Managed Care – PPO | Admitting: Adult Health

## 2022-04-14 DIAGNOSIS — G4733 Obstructive sleep apnea (adult) (pediatric): Secondary | ICD-10-CM

## 2022-04-14 NOTE — Progress Notes (Signed)
PATIENT: Mark Gilmore DOB: Jan 04, 1966  REASON FOR VISIT: follow up HISTORY FROM: patient PRIMARY NEUROLOGIST:   Virtual Visit via Video Note  I connected with Mark Gilmore on 04/14/22 at  1:30 PM EDT by a video enabled telemedicine application located remotely at [facility] and verified that I am speaking with the correct person using two identifiers who was located at their own home.   I discussed the limitations of evaluation and management by telemedicine and the availability of in person appointments. The patient expressed understanding and agreed to proceed.   PATIENT: Mark Gilmore DOB: April 22, 1965  REASON FOR VISIT: follow up HISTORY FROM: patient  HISTORY OF PRESENT ILLNESS: Today 04/14/22:  Mark Gilmore is a 57 y.o. male with a history of OSA on CPAP . Returns today for follow-up.  He reports that the CPAP is working well.  At the last visit we turned off EPR and that has been beneficial to him.  He states he continues to notice the benefit when he uses the CPAP machine.  His download is below       REVIEW OF SYSTEMS: Out of a complete 14 system review of symptoms, the patient complains only of the following symptoms, and all other reviewed systems are negative.  ALLERGIES: No Known Allergies  HOME MEDICATIONS: Outpatient Medications Prior to Visit  Medication Sig Dispense Refill   amLODipine (NORVASC) 5 MG tablet Take 1 tablet (5 mg total) by mouth daily. 90 tablet 3   Ascorbic Acid (VITAMIN C PO) Take by mouth daily.     aspirin EC 81 MG tablet Take 81 mg 2 (two) times daily by mouth.     atorvastatin (LIPITOR) 80 MG tablet TAKE 1 TABLET BY MOUTH EVERY DAY 90 tablet 3   hydrochlorothiazide (HYDRODIURIL) 25 MG tablet Take 1 tablet (25 mg total) by mouth daily. 90 tablet 3   lisinopril (ZESTRIL) 40 MG tablet Take 1 tablet (40 mg total) by mouth daily. 90 tablet 3   MELATONIN ER PO Take by mouth at bedtime.     Multiple  Vitamins-Minerals (MULTIVITAMIN MEN 50+ PO) Take by mouth daily.     Multiple Vitamins-Minerals (ZINC PO) Take by mouth daily.     promethazine-dextromethorphan (PROMETHAZINE-DM) 6.25-15 MG/5ML syrup Take 5 mLs by mouth 2 (two) times daily as needed for cough. 118 mL 0   sildenafil (REVATIO) 20 MG tablet TAKE 1 - 5 TABS BY MOUTH DAILY 30 TO 60 MINUTES PRIOR TO INTERCOURSE AS NEEDED. 90 tablet 0   VITAMIN D PO Take by mouth daily.     No facility-administered medications prior to visit.    PAST MEDICAL HISTORY: Past Medical History:  Diagnosis Date   Carotid artery dissection ([place])    left; spontaneous   Colon cancer screening 01/2019   Cologuard NEG.  rpt 3 yrs.   History of pneumonia    History of shingles    Hypercholesterolemia    great response to atorva 2021   Hypertension    IFG (impaired fasting glucose)    108 07/2018 f/u visit.  Check A1c with NEXT f/u labs.   Obesity    OSA on CPAP    Dx'd by home sleep study 04/2014.  On auto PAP since that time; most recent compliance monitoring 07/20/14 [facility] [place], [place]).  Compliance monitoring 12/2017 by Dr. Rexene Alberts with [facility].   Stroke ([place]) 04/2014   difficulty with word search, tightness in R arm.  Some residual spasticity  in R arm.  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    PAST SURGICAL HISTORY: Past Surgical History:  Procedure Laterality Date   CAROTID STENT Left 08/23/2014   L carotid stent widely patent on carotid u/s by Dr. Gwenlyn Found 02/09/16--repeat 1 yr same 12/2017.   CHOLECYSTECTOMY  2006?   ELECTROCARDIOGRAM  04/22/2014   [facility]   HOME SLEEP STUDY  04/2014   Home sleep study: mild OSA   VASECTOMY     WISDOM TOOTH EXTRACTION      FAMILY HISTORY: Family History  Problem Relation Age of Onset   Hypertension Mother    Diabetes Mother    Stroke Mother 71       TIA's   Dementia Mother     Atrial fibrillation Mother        stent placement   Alzheimer's disease Mother    Sleep apnea Mother    Hypertension Father    Diabetes Father    Hypertension Sister    Diabetes Sister    Thyroid disease Sister    Sleep apnea Sister    Breast cancer Maternal Aunt 58   Kidney disease Maternal Grandmother        Dialysis   CVA Maternal Grandmother    Thyroid disease Maternal Grandmother    Congestive Heart Failure Maternal Grandfather    CVA Maternal Grandfather    Deep vein thrombosis Paternal Grandmother    Asthma Daughter     SOCIAL HISTORY: Social History   Socioeconomic History   Marital status: Married    Spouse name: Not on file   Number of children: Not on file   Years of education: Not on file   Highest education level: Not on file  Occupational History   Not on file  Tobacco Use   Smoking status: Former    Types: Cigarettes    Quit date: 11/17/1996    Years since quitting: 25.4   Smokeless tobacco: Former  Scientific laboratory technician Use: Never used  Substance and Sexual Activity   Alcohol use: No   Drug use: No   Sexual activity: Not on file  Other Topics Concern   Not on file  Social History Narrative   Married, 4 kids.   Moved from [place] to [facility] 08/2016.   Educ: masters degree   Occup: Psychologist, occupational.   Tob: 5 pack yr hx, quit about 1998.   No alcohol or drugs.   Social Determinants of Health   Financial Resource Strain: Not on file  Food Insecurity: Not on file  Transportation Needs: Not on file  Physical Activity: Not on file  Stress: Not on file  Social Connections: Not on file  Intimate Partner Violence: Not on file      PHYSICAL EXAM Generalized: Well developed, in no acute distress   Neurological examination  Mentation: Alert oriented to time, place, history taking. Follows all commands speech and language fluent Cranial nerve II-XII: Facial symmetry noted  DIAGNOSTIC DATA (LABS, IMAGING, TESTING) -  I reviewed patient records, labs, notes, testing and imaging myself where available.  Lab Results  Component Value Date   WBC 6.6 07/11/2020   HGB 16.4 07/11/2020   HCT 46.8 07/11/2020   MCV 89.9 07/11/2020   PLT 213.0 07/11/2020      Component Value Date/Time   NA 144 06/09/2021 0819   K 3.7 06/09/2021 0819   CL 109 06/09/2021 0819  CO2 24 06/09/2021 0819   GLUCOSE 107 (H) 06/09/2021 0819   BUN 19 06/09/2021 0819   CREATININE 0.91 06/09/2021 0819   CALCIUM 9.3 06/09/2021 0819   PROT 6.7 06/09/2021 0819   ALBUMIN 4.5 06/09/2021 0819   AST 24 06/09/2021 0819   ALT 51 06/09/2021 0819   ALKPHOS 60 06/09/2021 0819   BILITOT 1.5 (H) 06/09/2021 0819   Lab Results  Component Value Date   CHOL 132 08/25/2021   HDL 44.20 08/25/2021   LDLCALC 62 08/25/2021   LDLDIRECT 140.0 07/11/2020   TRIG 129.0 08/25/2021   CHOLHDL 3 08/25/2021   Lab Results  Component Value Date   HGBA1C 5.1 02/21/2019   No results found for: "VITAMINB12" Lab Results  Component Value Date   TSH 1.70 07/11/2020      ASSESSMENT AND PLAN 57 y.o. year old male  has a past medical history of Carotid artery dissection ([place]), Colon cancer screening (01/2019), History of pneumonia, History of shingles, Hypercholesterolemia, Hypertension, IFG (impaired fasting glucose), Obesity, OSA on CPAP, and Stroke ([place]) (04/2014). here with:  OSA on CPAP  CPAP compliance excellent Residual AHI is good Encouraged patient to continue using CPAP nightly and > 4 hours each night F/U in 1 year or sooner if needed    Ward Givens, MSN, NP-C 04/14/2022, 1:22 PM [facility] [address] [place], [place] [address] [phone]

## 2022-06-05 ENCOUNTER — Other Ambulatory Visit: Payer: Self-pay | Admitting: Family Medicine

## 2022-06-05 NOTE — Telephone Encounter (Signed)
Patient wife calling for refill request.  Patient stated he will run out of meds this weekend.  Patient due for office visit, last visit May 2023.  I scheduled appointment for 07/01/22 with Dr. Milinda Cave. Patient first available time due to work schedule and Memorial Day week.  [facility] [address] [facility] [place]  30 d/s of both meds please to pharmacy. Prescriptions have expired/or no refills.  amLODipine (NORVASC) 5 MG tablet    lisinopril (ZESTRIL) 40 MG tablet

## 2022-06-09 NOTE — Progress Notes (Signed)
[facility] Quality Team Note  Name: Mark Gilmore Date of Birth: [birth year] MRN: [id number] Date: 06/09/2022  [facility] Quality Team has reviewed this patient's chart, please see recommendations below:  [facility] Quality Other; (COL Gap- Patient has upcoming appt with [facility] 07/01/2022. Please offer FOBT / Cologuard Kit or Colonoscopy scheduling.)

## 2022-06-26 NOTE — Patient Instructions (Signed)

## 2022-07-01 ENCOUNTER — Encounter: Payer: Self-pay | Admitting: Family Medicine

## 2022-07-01 ENCOUNTER — Ambulatory Visit (INDEPENDENT_AMBULATORY_CARE_PROVIDER_SITE_OTHER): Payer: BC Managed Care – PPO | Admitting: Family Medicine

## 2022-07-01 VITALS — BP 112/77 | HR 70 | Ht 67.0 in | Wt 211.6 lb

## 2022-07-01 DIAGNOSIS — Z0000 Encounter for general adult medical examination without abnormal findings: Secondary | ICD-10-CM

## 2022-07-01 DIAGNOSIS — I10 Essential (primary) hypertension: Secondary | ICD-10-CM | POA: Diagnosis not present

## 2022-07-01 DIAGNOSIS — Z125 Encounter for screening for malignant neoplasm of prostate: Secondary | ICD-10-CM | POA: Diagnosis not present

## 2022-07-01 DIAGNOSIS — E7800 Pure hypercholesterolemia, unspecified: Secondary | ICD-10-CM | POA: Diagnosis not present

## 2022-07-01 DIAGNOSIS — Z1211 Encounter for screening for malignant neoplasm of colon: Secondary | ICD-10-CM

## 2022-07-01 LAB — CBC
HCT: 48.1 % (ref 39.0–52.0)
Hemoglobin: 16.5 g/dL (ref 13.0–17.0)
MCHC: 34.3 g/dL (ref 30.0–36.0)
MCV: 90.3 fl (ref 78.0–100.0)
Platelets: 213 10*3/uL (ref 150.0–400.0)
RBC: 5.33 Mil/uL (ref 4.22–5.81)
RDW: 13.1 % (ref 11.5–15.5)
WBC: 7.2 10*3/uL (ref 4.0–10.5)

## 2022-07-01 LAB — PSA: PSA: 1.13 ng/mL (ref 0.10–4.00)

## 2022-07-01 LAB — TSH: TSH: 1.33 u[IU]/mL (ref 0.35–5.50)

## 2022-07-01 MED ORDER — AMLODIPINE BESYLATE 5 MG PO TABS: 5.0000 mg | ORAL_TABLET | Freq: Every day | ORAL | 1 refills | Status: DC

## 2022-07-01 NOTE — Progress Notes (Signed)
Office Note 07/01/2022  CC:  Chief Complaint  Patient presents with   Medical Management of Chronic Issues    Pt is fasting    HPI:  Patient is a 57 y.o. male who is here for annual health maintenance exam and follow-up hypertension and hyperlipidemia. Mark Gilmore feels well. Home blood pressure checks consistently normal. He feels like he is eating a healthy diet.  He exercises fairly regularly. His last child--daughter--is getting ready to attend college.  Past Medical History:  Diagnosis Date   Carotid artery dissection (HCC)    left; spontaneous   Colon cancer screening 01/2019   Cologuard NEG.  rpt 3 yrs.   History of pneumonia    History of shingles    Hypercholesterolemia    great response to atorva 2021   Hypertension    IFG (impaired fasting glucose)    108 07/2018 f/u visit.  Check A1c with NEXT f/u labs.   Obesity    OSA on CPAP    Dx'd by home sleep study 04/2014.  On auto PAP since that time; most recent compliance monitoring 07/20/14 [facility], [place], [place]).  Compliance monitoring 12/2017 by Dr. Frances Furbish with [facility].   Stroke (HCC) 04/2014   difficulty with word search, tightness in R arm.  Some residual spasticity in R arm.  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    Past Surgical History:  Procedure Laterality Date   CAROTID STENT Left 08/23/2014   L carotid stent widely patent on carotid u/s by Dr. Allyson Sabal 02/09/16--repeat 1 yr same 12/2017.   CHOLECYSTECTOMY  2006?   ELECTROCARDIOGRAM  04/22/2014   [facility] [place]   HOME SLEEP STUDY  04/2014   Home sleep study: mild OSA   VASECTOMY     WISDOM TOOTH EXTRACTION      Family History  Problem Relation Age of Onset   Hypertension Mother    Diabetes Mother    Stroke Mother 48       TIA's   Dementia Mother    Atrial fibrillation Mother        stent placement   Alzheimer's disease Mother    Sleep apnea  Mother    Hypertension Father    Diabetes Father    Hypertension Sister    Diabetes Sister    Thyroid disease Sister    Sleep apnea Sister    Breast cancer Maternal Aunt 71   Kidney disease Maternal Grandmother        Dialysis   CVA Maternal Grandmother    Thyroid disease Maternal Grandmother    Congestive Heart Failure Maternal Grandfather    CVA Maternal Grandfather    Deep vein thrombosis Paternal Grandmother    Asthma Daughter     Social History   Socioeconomic History   Marital status: Married    Spouse name: Not on file   Number of children: Not on file   Years of education: Not on file   Highest education level: Not on file  Occupational History   Not on file  Tobacco Use   Smoking status: Former    Types: Cigarettes    Quit date: 11/17/1996    Years since quitting: 25.6   Smokeless tobacco: Former  Building services engineer Use: Never used  Substance and Sexual Activity   Alcohol use: No   Drug use: No   Sexual activity: Not on file  Other Topics Concern   Not on file  Social History Narrative   Married, 4 kids.   Moved from [place] to [facility] 08/2016.   Educ: masters degree   Occup: Corporate investment banker.   Tob: 5 pack yr hx, quit about 1998.   No alcohol or drugs.   Social Determinants of Health   Financial Resource Strain: Not on file  Food Insecurity: Not on file  Transportation Needs: Not on file  Physical Activity: Not on file  Stress: Not on file  Social Connections: Not on file  Intimate Partner Violence: Not on file    Outpatient Medications Prior to Visit  Medication Sig Dispense Refill   Ascorbic Acid (VITAMIN C PO) Take by mouth daily.     aspirin EC 81 MG tablet Take 81 mg 2 (two) times daily by mouth.     atorvastatin (LIPITOR) 80 MG tablet TAKE 1 TABLET BY MOUTH EVERY DAY 90 tablet 3   hydrochlorothiazide (HYDRODIURIL) 25 MG tablet Take 1 tablet (25 mg total) by mouth daily. MUST KEEP APPT FOR FURTHER REFILLS 90  tablet 3   lisinopril (ZESTRIL) 40 MG tablet Take 1 tablet (40 mg total) by mouth daily. MUST KEEP APPT FOR FURTHER REFILLS 90 tablet 3   Multiple Vitamins-Minerals (MULTIVITAMIN MEN 50+ PO) Take by mouth daily.     Multiple Vitamins-Minerals (ZINC PO) Take by mouth daily.     sildenafil (REVATIO) 20 MG tablet TAKE 1 - 5 TABS BY MOUTH DAILY 30 TO 60 MINUTES PRIOR TO INTERCOURSE AS NEEDED. 90 tablet 0   VITAMIN D PO Take by mouth daily.     amLODipine (NORVASC) 5 MG tablet Take 1 tablet (5 mg total) by mouth daily. MUST KEEP APPT FOR FURTHER REFILLS 30 tablet 0   MELATONIN ER PO Take by mouth at bedtime.     promethazine-dextromethorphan (PROMETHAZINE-DM) 6.25-15 MG/5ML syrup Take 5 mLs by mouth 2 (two) times daily as needed for cough. 118 mL 0   No facility-administered medications prior to visit.    No Known Allergies  Review of Systems  Constitutional:  Negative for appetite change, chills, fatigue and fever.  HENT:  Negative for congestion, dental problem, ear pain and sore throat.   Eyes:  Negative for discharge, redness and visual disturbance.  Respiratory:  Negative for cough, chest tightness, shortness of breath and wheezing.   Cardiovascular:  Negative for chest pain, palpitations and leg swelling.  Gastrointestinal:  Negative for abdominal pain, blood in stool, diarrhea, nausea and vomiting.  Genitourinary:  Negative for difficulty urinating, dysuria, flank pain, frequency, hematuria and urgency.  Musculoskeletal:  Negative for arthralgias, back pain, joint swelling, myalgias and neck stiffness.  Skin:  Negative for pallor and rash.  Neurological:  Negative for dizziness, speech difficulty, weakness and headaches.  Hematological:  Negative for adenopathy. Does not bruise/bleed easily.  Psychiatric/Behavioral:  Negative for confusion and sleep disturbance. The patient is not nervous/anxious.     PE;    07/01/2022    8:37 AM 09/17/2021   11:26 AM 07/21/2021    7:10 PM  Vitals  with BMI  Height 5\' 7"  5\' 7"    Weight 211 lbs 10 oz 217 lbs   BMI 33.13 33.98   Systolic 112 114 098  Diastolic 77 70 80  Pulse 70 74 101     Gen: Alert, well appearing.  Patient is oriented to person, place, time, and situation. AFFECT: pleasant, lucid thought and speech. ENT: Ears: EACs clear, normal epithelium.  TMs with good light reflex and landmarks  bilaterally.  Eyes: no injection, icteris, swelling, or exudate.  EOMI, PERRLA. Nose: no drainage or turbinate edema/swelling.  No injection or focal lesion.  Mouth: lips without lesion/swelling.  Oral mucosa pink and moist.  Dentition intact and without obvious caries or gingival swelling.  Oropharynx without erythema, exudate, or swelling.  Neck: supple/nontender.  No LAD, mass, or TM.  Carotid pulses 2+ bilaterally, without bruits. CV: RRR, no m/r/g.   LUNGS: CTA bilat, nonlabored resps, good aeration in all lung fields. ABD: soft, NT, ND, BS normal.  No hepatospenomegaly or mass.  No bruits. EXT: no clubbing, cyanosis, or edema.  Musculoskeletal: no joint swelling, erythema, warmth, or tenderness.  ROM of all joints intact. Skin - no sores or suspicious lesions or rashes or color changes  Pertinent labs:  Lab Results  Component Value Date   TSH 1.70 07/11/2020   Lab Results  Component Value Date   WBC 6.6 07/11/2020   HGB 16.4 07/11/2020   HCT 46.8 07/11/2020   MCV 89.9 07/11/2020   PLT 213.0 07/11/2020   Lab Results  Component Value Date   CREATININE 0.91 06/09/2021   BUN 19 06/09/2021   NA 144 06/09/2021   K 3.7 06/09/2021   CL 109 06/09/2021   CO2 24 06/09/2021   Lab Results  Component Value Date   ALT 51 06/09/2021   AST 24 06/09/2021   ALKPHOS 60 06/09/2021   BILITOT 1.5 (H) 06/09/2021   Lab Results  Component Value Date   CHOL 132 08/25/2021   Lab Results  Component Value Date   HDL 44.20 08/25/2021   Lab Results  Component Value Date   LDLCALC 62 08/25/2021   Lab Results  Component Value  Date   TRIG 129.0 08/25/2021   Lab Results  Component Value Date   CHOLHDL 3 08/25/2021   Lab Results  Component Value Date   PSA 0.69 07/11/2020   PSA 0.78 02/21/2019   PSA 0.89 02/17/2018   Lab Results  Component Value Date   HGBA1C 5.1 02/21/2019   ASSESSMENT AND PLAN:   #1 health maintenance exam: Reviewed age and gender appropriate health maintenance issues (prudent diet, regular exercise, health risks of tobacco and excessive alcohol, use of seatbelts, fire alarms in home, use of sunscreen).  Also reviewed age and gender appropriate health screening as well as vaccine recommendations. Vaccines: ALL UTD. Labs: HP labs+ PSA. Prostate ca screening: PSA today. Colon ca screening: cologuard neg 01/2019-->rpt ordered today.  #2 hypertension, great control on amlodipine 5 mg a day, hydrochlorothiazide 25 mg a day, and lisinopril 40 mg a day. Electrolytes and creatinine today.  2.  Hypercholesterolemia, doing well on atorvastatin 80 mg a day. Lipid panel and hepatic panel today.  An After Visit Summary was printed and given to the patient.  FOLLOW UP:  Return in about 6 months (around 12/31/2022) for routine chronic illness f/u.  Signed:  Santiago Bumpers, MD           07/01/2022

## 2022-07-02 ENCOUNTER — Encounter: Payer: Self-pay | Admitting: Family Medicine

## 2022-07-02 LAB — LIPID PANEL
Cholesterol: 121 mg/dL (ref 0–200)
HDL: 47.4 mg/dL (ref >39.00)
LDL Cholesterol: 49 mg/dL (ref 0–99)
NonHDL: 73.95
Total CHOL/HDL Ratio: 3
Triglycerides: 124 mg/dL (ref 0.0–149.0)
VLDL: 24.8 mg/dL (ref 0.0–40.0)

## 2022-07-02 LAB — COMPREHENSIVE METABOLIC PANEL
ALT: 52 U/L (ref 0–53)
AST: 27 U/L (ref 0–37)
Albumin: 4.7 g/dL (ref 3.5–5.2)
Alkaline Phosphatase: 68 U/L (ref 39–117)
BUN: 19 mg/dL (ref 6–23)
CO2: 21 mEq/L (ref 19–32)
Calcium: 9.8 mg/dL (ref 8.4–10.5)
Chloride: 105 mEq/L (ref 96–112)
Creatinine, Ser: 0.96 mg/dL (ref 0.40–1.50)
GFR: 89.33 mL/min (ref >60.00)
Glucose, Bld: 105 mg/dL — ABNORMAL HIGH (ref 70–99)
Potassium: 3.7 mEq/L (ref 3.5–5.1)
Sodium: 142 mEq/L (ref 135–145)
Total Bilirubin: 1.6 mg/dL — ABNORMAL HIGH (ref 0.2–1.2)
Total Protein: 7.2 g/dL (ref 6.0–8.3)

## 2022-07-06 ENCOUNTER — Other Ambulatory Visit: Payer: Self-pay | Admitting: Family Medicine

## 2022-07-25 DIAGNOSIS — G4733 Obstructive sleep apnea (adult) (pediatric): Secondary | ICD-10-CM | POA: Diagnosis not present

## 2022-07-30 ENCOUNTER — Other Ambulatory Visit: Payer: Self-pay | Admitting: Family Medicine

## 2022-08-13 DIAGNOSIS — Z1211 Encounter for screening for malignant neoplasm of colon: Secondary | ICD-10-CM | POA: Diagnosis not present

## 2022-08-19 LAB — COLOGUARD: COLOGUARD: NEGATIVE

## 2022-08-20 ENCOUNTER — Encounter: Payer: Self-pay | Admitting: Family Medicine

## 2022-08-24 DIAGNOSIS — G4733 Obstructive sleep apnea (adult) (pediatric): Secondary | ICD-10-CM | POA: Diagnosis not present

## 2022-09-07 NOTE — Progress Notes (Unsigned)
PATIENT: Mark Gilmore DOB: 05-02-1965  REASON FOR VISIT: follow up HISTORY FROM: patient PRIMARY NEUROLOGIST: Dr. Frances Furbish  HISTORY OF PRESENT ILLNESS: Today 09/07/22:  Mark Gilmore is a 57 y.o. male with a history of OSA on CPAP. Returns today for follow-up.      HISTORY 04/14/22:   Mark Gilmore is a 57 y.o. male with a history of OSA on CPAP . Returns today for follow-up.  He reports that the CPAP is working well.  At the last visit we turned off EPR and that has been beneficial to him.  He states he continues to notice the benefit when he uses the CPAP machine.  His download is below         REVIEW OF SYSTEMS: Out of a complete 14 system review of symptoms, the patient complains only of the following symptoms, and all other reviewed systems are negative.  FSS ESS  ALLERGIES: No Known Allergies  HOME MEDICATIONS: Outpatient Medications Prior to Visit  Medication Sig Dispense Refill   amLODipine (NORVASC) 5 MG tablet Take 1 tablet (5 mg total) by mouth daily. 90 tablet 1   Ascorbic Acid (VITAMIN C PO) Take by mouth daily.     aspirin EC 81 MG tablet Take 81 mg 2 (two) times daily by mouth.     atorvastatin (LIPITOR) 80 MG tablet TAKE 1 TABLET BY MOUTH EVERY DAY 90 tablet 1   hydrochlorothiazide (HYDRODIURIL) 25 MG tablet Take 1 tablet (25 mg total) by mouth daily. MUST KEEP APPT FOR FURTHER REFILLS 90 tablet 3   lisinopril (ZESTRIL) 40 MG tablet Take 1 tablet (40 mg total) by mouth daily. MUST KEEP APPT FOR FURTHER REFILLS 90 tablet 3   Multiple Vitamins-Minerals (MULTIVITAMIN MEN 50+ PO) Take by mouth daily.     Multiple Vitamins-Minerals (ZINC PO) Take by mouth daily.     sildenafil (REVATIO) 20 MG tablet TAKE 1 - 5 TABS BY MOUTH DAILY 30 TO 60 MINUTES PRIOR TO INTERCOURSE AS NEEDED. 90 tablet 0   VITAMIN D PO Take by mouth daily.     No facility-administered medications prior to visit.    PAST MEDICAL HISTORY: Past Medical History:  Diagnosis Date   Carotid  artery dissection (HCC)    left; spontaneous   Colon cancer screening 01/2019   Cologuard NEG 2021 and 2024   Sullivan Lone disease    History of pneumonia    History of shingles    Hypercholesterolemia    great response to atorva 2021   Hypertension    IFG (impaired fasting glucose)    108 07/2018 f/u visit.  Check A1c with NEXT f/u labs.   Obesity    OSA on CPAP    Dx'd by home sleep study 04/2014.  On auto PAP since that time; most recent compliance monitoring 07/20/14 [facility], [place], [place]).  Compliance monitoring 12/2017 by Dr. Frances Furbish with [facility].   Stroke (HCC) 04/2014   difficulty with word search, tightness in R arm.  Some residual spasticity in R arm.  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    PAST SURGICAL HISTORY: Past Surgical History:  Procedure Laterality Date   CAROTID STENT Left 08/23/2014   L carotid stent widely patent on carotid u/s by Dr. Allyson Sabal 02/09/16--repeat 1 yr same 12/2017.   CHOLECYSTECTOMY  2006?   ELECTROCARDIOGRAM  04/22/2014   [facility] [place]   HOME SLEEP STUDY  04/2014   Home sleep study:  mild OSA   VASECTOMY     WISDOM TOOTH EXTRACTION      FAMILY HISTORY: Family History  Problem Relation Age of Onset   Hypertension Mother    Diabetes Mother    Stroke Mother 17       TIA's   Dementia Mother    Atrial fibrillation Mother        stent placement   Alzheimer's disease Mother    Sleep apnea Mother    Hypertension Father    Diabetes Father    Hypertension Sister    Diabetes Sister    Thyroid disease Sister    Sleep apnea Sister    Breast cancer Maternal Aunt 30   Kidney disease Maternal Grandmother        Dialysis   CVA Maternal Grandmother    Thyroid disease Maternal Grandmother    Congestive Heart Failure Maternal Grandfather    CVA Maternal Grandfather    Deep vein thrombosis Paternal Grandmother    Asthma Daughter     SOCIAL  HISTORY: Social History   Socioeconomic History   Marital status: Married    Spouse name: Not on file   Number of children: Not on file   Years of education: Not on file   Highest education level: Not on file  Occupational History   Not on file  Tobacco Use   Smoking status: Former    Current packs/day: 0.00    Types: Cigarettes    Quit date: 11/17/1996    Years since quitting: 25.8   Smokeless tobacco: Former  Building services engineer status: Never Used  Substance and Sexual Activity   Alcohol use: No   Drug use: No   Sexual activity: Not on file  Other Topics Concern   Not on file  Social History Narrative   Married, 4 kids.   Moved from [place] to [facility] 08/2016.   Educ: masters degree   Occup: Corporate investment banker.   Tob: 5 pack yr hx, quit about 1998.   No alcohol or drugs.   Social Determinants of Health   Financial Resource Strain: Not on file  Food Insecurity: Not on file  Transportation Needs: Not on file  Physical Activity: Not on file  Stress: Not on file  Social Connections: Not on file  Intimate Partner Violence: Not on file      PHYSICAL EXAM  There were no vitals filed for this visit. There is no height or weight on file to calculate BMI.  Generalized: Well developed, in no acute distress  Chest: Lungs clear to auscultation bilaterally  Neurological examination  Mentation: Alert oriented to time, place, history taking. Follows all commands speech and language fluent Cranial nerve II-XII: Extraocular movements were full, visual field were full on confrontational test Head turning and shoulder shrug  were normal and symmetric. Motor: The motor testing reveals 5 over 5 strength of all 4 extremities. Good symmetric motor tone is noted throughout.  Sensory: Sensory testing is intact to soft touch on all 4 extremities. No evidence of extinction is noted.  Gait and station: Gait is normal.    DIAGNOSTIC DATA (LABS, IMAGING,  TESTING) - I reviewed patient records, labs, notes, testing and imaging myself where available.  Lab Results  Component Value Date   WBC 7.2 07/01/2022   HGB 16.5 07/01/2022   HCT 48.1 07/01/2022   MCV 90.3 07/01/2022   PLT 213.0 07/01/2022      Component Value Date/Time  NA 142 07/01/2022 0859   K 3.7 07/01/2022 0859   CL 105 07/01/2022 0859   CO2 21 07/01/2022 0859   GLUCOSE 105 (H) 07/01/2022 0859   BUN 19 07/01/2022 0859   CREATININE 0.96 07/01/2022 0859   CALCIUM 9.8 07/01/2022 0859   PROT 7.2 07/01/2022 0859   ALBUMIN 4.7 07/01/2022 0859   AST 27 07/01/2022 0859   ALT 52 07/01/2022 0859   ALKPHOS 68 07/01/2022 0859   BILITOT 1.6 (H) 07/01/2022 0859   Lab Results  Component Value Date   CHOL 121 07/01/2022   HDL 47.40 07/01/2022   LDLCALC 49 07/01/2022   LDLDIRECT 140.0 07/11/2020   TRIG 124.0 07/01/2022   CHOLHDL 3 07/01/2022   Lab Results  Component Value Date   HGBA1C 5.1 02/21/2019   No results found for: "VITAMINB12" Lab Results  Component Value Date   TSH 1.33 07/01/2022      ASSESSMENT AND PLAN 57 y.o. year old male  has a past medical history of Carotid artery dissection (HCC), Colon cancer screening (01/2019), Sullivan Lone disease, History of pneumonia, History of shingles, Hypercholesterolemia, Hypertension, IFG (impaired fasting glucose), Obesity, OSA on CPAP, and Stroke (HCC) (04/2014). here with:  OSA on CPAP  - CPAP compliance excellent - Good treatment of AHI  - Encourage patient to use CPAP nightly and > 4 hours each night - F/U in 1 year or sooner if needed    Butch Penny, MSN, NP-C 09/07/2022, 11:19 AM [facility] [address] [place], [place] [address] [phone]

## 2022-09-08 ENCOUNTER — Ambulatory Visit (INDEPENDENT_AMBULATORY_CARE_PROVIDER_SITE_OTHER): Payer: BC Managed Care – PPO | Admitting: Adult Health

## 2022-09-08 ENCOUNTER — Encounter: Payer: Self-pay | Admitting: Adult Health

## 2022-09-08 VITALS — BP 114/79 | HR 65 | Ht 67.0 in | Wt 216.0 lb

## 2022-09-08 DIAGNOSIS — G4733 Obstructive sleep apnea (adult) (pediatric): Secondary | ICD-10-CM

## 2022-09-08 NOTE — Patient Instructions (Signed)
Continue using CPAP nightly and greater than 4 hours each night °If your symptoms worsen or you develop new symptoms please let us know.  ° °

## 2022-09-24 DIAGNOSIS — G4733 Obstructive sleep apnea (adult) (pediatric): Secondary | ICD-10-CM | POA: Diagnosis not present

## 2022-12-14 DIAGNOSIS — G4733 Obstructive sleep apnea (adult) (pediatric): Secondary | ICD-10-CM | POA: Diagnosis not present

## 2022-12-31 ENCOUNTER — Ambulatory Visit: Payer: BC Managed Care – PPO | Admitting: Family Medicine

## 2023-01-13 DIAGNOSIS — G4733 Obstructive sleep apnea (adult) (pediatric): Secondary | ICD-10-CM | POA: Diagnosis not present

## 2023-01-26 LAB — HEPATIC FUNCTION PANEL
ALT: 47 U/L — AB (ref 10–40)
AST: 25 (ref 14–40)
Alkaline Phosphatase: 79 (ref 25–125)
Bilirubin, Total: 2.2

## 2023-01-26 LAB — BASIC METABOLIC PANEL
BUN: 17 (ref 4–21)
CO2: 26 — AB (ref 13–22)
Chloride: 104 (ref 99–108)
Creatinine: 1.2 (ref 0.6–1.3)
Glucose: 153
Potassium: 3.7 meq/L (ref 3.5–5.1)
Sodium: 138 (ref 137–147)

## 2023-01-26 LAB — COMPREHENSIVE METABOLIC PANEL
Albumin: 5 (ref 3.5–5.0)
eGFR: 75

## 2023-01-26 LAB — CBC: RBC: 5.74 — AB (ref 3.87–5.11)

## 2023-01-26 LAB — CBC AND DIFFERENTIAL
HCT: 50 (ref 41–53)
Hemoglobin: 18 — AB (ref 13.5–17.5)
Platelets: 286 10*3/uL (ref 150–400)
WBC: 14.8

## 2023-02-04 ENCOUNTER — Other Ambulatory Visit: Payer: Self-pay | Admitting: Family Medicine

## 2023-02-09 ENCOUNTER — Ambulatory Visit (INDEPENDENT_AMBULATORY_CARE_PROVIDER_SITE_OTHER): Payer: BC Managed Care – PPO | Admitting: Family Medicine

## 2023-02-09 ENCOUNTER — Encounter: Payer: Self-pay | Admitting: Family Medicine

## 2023-02-09 VITALS — BP 123/85 | HR 60 | Wt 214.4 lb

## 2023-02-09 DIAGNOSIS — K2900 Acute gastritis without bleeding: Secondary | ICD-10-CM | POA: Diagnosis not present

## 2023-02-09 DIAGNOSIS — K921 Melena: Secondary | ICD-10-CM | POA: Diagnosis not present

## 2023-02-09 LAB — CBC WITH DIFFERENTIAL/PLATELET
Basophils Absolute: 0.1 10*3/uL (ref 0.0–0.1)
Basophils Relative: 0.8 % (ref 0.0–3.0)
Eosinophils Absolute: 0.2 10*3/uL (ref 0.0–0.7)
Eosinophils Relative: 2.7 % (ref 0.0–5.0)
HCT: 49.1 % (ref 39.0–52.0)
Hemoglobin: 17 g/dL (ref 13.0–17.0)
Lymphocytes Relative: 27.7 % (ref 12.0–46.0)
Lymphs Abs: 2.4 10*3/uL (ref 0.7–4.0)
MCHC: 34.5 g/dL (ref 30.0–36.0)
MCV: 91 fL (ref 78.0–100.0)
Monocytes Absolute: 0.4 10*3/uL (ref 0.1–1.0)
Monocytes Relative: 5.1 % (ref 3.0–12.0)
Neutro Abs: 5.4 10*3/uL (ref 1.4–7.7)
Neutrophils Relative %: 63.7 % (ref 43.0–77.0)
Platelets: 223 10*3/uL (ref 150.0–400.0)
RBC: 5.4 Mil/uL (ref 4.22–5.81)
RDW: 13.3 % (ref 11.5–15.5)
WBC: 8.5 10*3/uL (ref 4.0–10.5)

## 2023-02-09 LAB — SEDIMENTATION RATE: Sed Rate: 6 mm/h (ref 0–20)

## 2023-02-09 MED ORDER — OMEPRAZOLE 40 MG PO CPDR: 40.0000 mg | DELAYED_RELEASE_CAPSULE | Freq: Every day | ORAL | 1 refills | Status: DC

## 2023-02-09 NOTE — Progress Notes (Signed)
 02/09/2023  CC:  Chief Complaint  Patient presents with   Hospitalization Follow-up    ED F/U. Pt c/o blood in stool/slight constipation. Denies any pain. Has started to take stool softener.     Patient is a 58 y.o.  male with hypertension and hypercholesterolemia who presents for emergency department follow-up.  Date of emergency visit was 01/26/2023, location was [place]  [facility]. Days since d/c from emergency department: 15 Patient was discharged from hospital to home He presented for abdominal pain and vomiting. Complete metabolic panel was normal, CBC showed white blood cell count 14.88 with increased PMNs.  Hemoglobin was 18, platelet count 286.  Influenza testing negative.  Troponin negative.  CT abdomen was obtained.  EKG showed normal sinus rhythm, first-degree AV block, no ischemic changes. CT scan of the abdomen and pelvis with contrast showed suspected gastritis involving the antral pyloric region with upstream distention of the stomach suggesting luminal narrowing/outlet obstruction.  There are also findings of enteritis.  Suspected findings of gastroenteritis are nonspecific but likely infectious or inflammatory.  Hepatic steatosis also noted.   CURRENTLY: Abdominal pain resolved shortly after he was released from the ED.  He has not had a return of any problems with abdominal pain or distention.  No nausea.  He has a little bit of reflux that he says does not bother him that much. Since going home from the ER he has had problem with hard stools that has been difficult to pass and has had blood in it.  He describes blood within the stool pieces as well as bright red blood when he wipes and some drops in the toilet.  Does not describe any acute anal tearing sensation.  ROS as above, plus--> no fevers, no CP, no SOB, no wheezing, no cough, no dizziness, no HAs, no rashes, no melena.  No polyuria or polydipsia.  No myalgias or arthralgias.  No focal  weakness, paresthesias, or tremors.  No acute vision or hearing abnormalities.  No dysuria or unusual/new urinary urgency or frequency.  No recent changes in lower legs.  No palpitations.    PMH:  Past Medical History:  Diagnosis Date   Carotid artery dissection (HCC)    left; spontaneous   Colon cancer screening 01/2019   Cologuard NEG 2021 and 2024   Bertrum disease    History of pneumonia    History of shingles    Hypercholesterolemia    great response to atorva 2021   Hypertension    IFG (impaired fasting glucose)    108 07/2018 f/u visit.  Check A1c with NEXT f/u labs.   Obesity    OSA on CPAP    Dx'd by home sleep study 04/2014.  On auto PAP since that time; most recent compliance monitoring 07/20/14 ([facility], [place], [place]).  Compliance monitoring 12/2017 by Dr. Buck with [facility].   Stroke (HCC) 04/2014   difficulty with word search, tightness in R arm.  Some residual spasticity in R arm.  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    PSH:  Past Surgical History:  Procedure Laterality Date   CAROTID STENT Left 08/23/2014   L carotid stent widely patent on carotid u/s by Dr. Court 02/09/16--repeat 1 yr same 12/2017.   CHOLECYSTECTOMY  2006?   ELECTROCARDIOGRAM  04/22/2014   [facility] [place]   HOME SLEEP STUDY  04/2014   Home sleep study: mild OSA   VASECTOMY  WISDOM TOOTH EXTRACTION      MEDS:  Outpatient Medications Prior to Visit  Medication Sig Dispense Refill   amLODipine  (NORVASC ) 5 MG tablet Take 1 tablet (5 mg total) by mouth daily. 90 tablet 1   Ascorbic Acid (VITAMIN C PO) Take by mouth daily.     aspirin EC 81 MG tablet Take 81 mg 2 (two) times daily by mouth.     atorvastatin  (LIPITOR) 80 MG tablet TAKE 1 TABLET BY MOUTH EVERY DAY 30 tablet 0   hydrochlorothiazide  (HYDRODIURIL ) 25 MG tablet Take 1 tablet (25 mg total) by mouth daily. MUST KEEP APPT  FOR FURTHER REFILLS 90 tablet 3   lisinopril  (ZESTRIL ) 40 MG tablet Take 1 tablet (40 mg total) by mouth daily. MUST KEEP APPT FOR FURTHER REFILLS 90 tablet 3   Multiple Vitamins-Minerals (MULTIVITAMIN MEN 50+ PO) Take by mouth daily.     Multiple Vitamins-Minerals (ZINC PO) Take by mouth daily.     sildenafil  (REVATIO ) 20 MG tablet TAKE 1 - 5 TABS BY MOUTH DAILY 30 TO 60 MINUTES PRIOR TO INTERCOURSE AS NEEDED. 90 tablet 0   VITAMIN D PO Take by mouth daily.     No facility-administered medications prior to visit.    Physical Exam     02/09/2023    9:03 AM 09/08/2022    8:21 AM 07/01/2022    8:37 AM  Vitals with BMI  Height  5' 7 5' 7  Weight 214 lbs 6 oz 216 lbs 211 lbs 10 oz  BMI  33.82 33.13  Systolic 123 114 887  Diastolic 85 79 77  Pulse 60 65 70   Gen: Alert, well appearing.  Patient is oriented to person, place, time, and situation. AFFECT: pleasant, lucid thought and speech. ZWU:Zbzd: no injection, icteris, swelling, or exudate.  EOMI, PERRLA. Mouth: lips without lesion/swelling.  Oral mucosa pink and moist. Oropharynx without erythema, exudate, or swelling.  CV: RRR, no m/r/g.   LUNGS: CTA bilat, nonlabored resps, good aeration in all lung fields. ABD: soft, NT, ND, BS normal.  No hepatospenomegaly or mass.  No bruits.   Pertinent labs/imaging Last CBC Lab Results  Component Value Date   WBC 7.2 07/01/2022   HGB 16.5 07/01/2022   HCT 48.1 07/01/2022   MCV 90.3 07/01/2022   RDW 13.1 07/01/2022   PLT 213.0 07/01/2022   Last metabolic panel Lab Results  Component Value Date   GLUCOSE 105 (H) 07/01/2022   NA 142 07/01/2022   K 3.7 07/01/2022   CL 105 07/01/2022   CO2 21 07/01/2022   BUN 19 07/01/2022   CREATININE 0.96 07/01/2022   GFR 89.33 07/01/2022   CALCIUM  9.8 07/01/2022   PROT 7.2 07/01/2022   ALBUMIN 4.7 07/01/2022   BILITOT 1.6 (H) 07/01/2022   ALKPHOS 68 07/01/2022   AST 27 07/01/2022   ALT 52 07/01/2022   Lab Results  Component Value Date    HGBA1C 5.1 02/21/2019   ASSESSMENT/PLAN:  1 acute gastritis, enteritis, and now hematochezia. Fortunately all of the symptoms have resolved he just has a bit of continuing red blood mixed with his stool. CT with contrast findings  distention of the stomach suggesting luminal narrowing/outlet obstruction. No symptoms to correlate with this now. I'll start omeprazole  40 mg every day and will do H pylori breath test. He does not take NSAIDs. Will do stool culture as well as C. difficile PCR.  Will refer to gastroenterology. Recheck CBC today to follow the mild leukocytosis he had at  his presentation in the ER.  FOLLOW UP:  1 mo RCI  Signed:  Gerlene Hockey, MD           02/09/2023

## 2023-02-11 DIAGNOSIS — K921 Melena: Secondary | ICD-10-CM | POA: Diagnosis not present

## 2023-02-11 LAB — H. PYLORI BREATH TEST: H. pylori Breath Test: NOT DETECTED

## 2023-02-12 DIAGNOSIS — K625 Hemorrhage of anus and rectum: Secondary | ICD-10-CM | POA: Diagnosis not present

## 2023-02-12 DIAGNOSIS — R935 Abnormal findings on diagnostic imaging of other abdominal regions, including retroperitoneum: Secondary | ICD-10-CM | POA: Diagnosis not present

## 2023-02-12 DIAGNOSIS — K760 Fatty (change of) liver, not elsewhere classified: Secondary | ICD-10-CM | POA: Diagnosis not present

## 2023-02-13 DIAGNOSIS — G4733 Obstructive sleep apnea (adult) (pediatric): Secondary | ICD-10-CM | POA: Diagnosis not present

## 2023-02-15 LAB — SALMONELLA/SHIGELLA CULT, CAMPY EIA AND SHIGA TOXIN RFL ECOLI
MICRO NUMBER: 15964679
MICRO NUMBER:: 15964680
MICRO NUMBER:: 15964681
Result:: NOT DETECTED
SHIGA RESULT:: NOT DETECTED
SPECIMEN QUALITY: ADEQUATE
SPECIMEN QUALITY:: ADEQUATE
SPECIMEN QUALITY:: ADEQUATE

## 2023-02-15 LAB — CLOSTRIDIUM DIFFICILE TOXIN B, QUALITATIVE, REAL-TIME PCR: Toxigenic C. Difficile by PCR: NOT DETECTED

## 2023-03-02 ENCOUNTER — Other Ambulatory Visit: Payer: Self-pay | Admitting: Family Medicine

## 2023-03-03 ENCOUNTER — Other Ambulatory Visit: Payer: Self-pay | Admitting: Family Medicine

## 2023-03-25 DIAGNOSIS — G4733 Obstructive sleep apnea (adult) (pediatric): Secondary | ICD-10-CM | POA: Diagnosis not present

## 2023-03-27 ENCOUNTER — Other Ambulatory Visit: Payer: Self-pay | Admitting: Family Medicine

## 2023-03-31 ENCOUNTER — Other Ambulatory Visit: Payer: Self-pay | Admitting: Family Medicine

## 2023-04-04 ENCOUNTER — Other Ambulatory Visit: Payer: Self-pay | Admitting: Family Medicine

## 2023-04-06 ENCOUNTER — Other Ambulatory Visit: Payer: Self-pay | Admitting: Family Medicine

## 2023-04-19 ENCOUNTER — Encounter: Payer: Self-pay | Admitting: *Deleted

## 2023-04-20 ENCOUNTER — Telehealth (INDEPENDENT_AMBULATORY_CARE_PROVIDER_SITE_OTHER): Payer: BC Managed Care – PPO | Admitting: Adult Health

## 2023-04-20 DIAGNOSIS — G4733 Obstructive sleep apnea (adult) (pediatric): Secondary | ICD-10-CM

## 2023-04-20 NOTE — Patient Instructions (Signed)
 Continue using CPAP nightly and greater than 4 hours each night If your symptoms worsen or you develop new symptoms please let us know.

## 2023-04-20 NOTE — Progress Notes (Signed)
 PATIENT: Mark Gilmore DOB: January 27, 1966  REASON FOR VISIT: follow up HISTORY FROM: patient PRIMARY NEUROLOGIST: Dr. Frances Furbish   Virtual Visit via Video Note  I connected with Georgina Pillion on 04/20/23 at  1:15 PM EDT by a video enabled telemedicine application located remotely at [facility] and verified that I am speaking with the correct person using two identifiers who was located at their own home.   I discussed the limitations of evaluation and management by telemedicine and the availability of in person appointments. The patient expressed understanding and agreed to proceed.   PATIENT: Mark Gilmore DOB: 24-Jan-1966  REASON FOR VISIT: follow up HISTORY FROM: patient  HISTORY OF PRESENT ILLNESS: Today 04/20/23:  Balin Vandegrift is a 58 y.o. male with a history of OSA on CPAP. Returns today for follow-up.  Reports that CPAP is working well.  Denies any new issues.  Returns today for follow-up.       REVIEW OF SYSTEMS: Out of a complete 14 system review of symptoms, the patient complains only of the following symptoms, and all other reviewed systems are negative.  ALLERGIES: No Known Allergies  HOME MEDICATIONS: Outpatient Medications Prior to Visit  Medication Sig Dispense Refill   amLODipine (NORVASC) 5 MG tablet Take 1 tablet (5 mg total) by mouth daily. OFFICE VISIT NEEDED FOR FURTHER REFILLS 30 tablet 0   Ascorbic Acid (VITAMIN C PO) Take by mouth daily.     aspirin EC 81 MG tablet Take 81 mg 2 (two) times daily by mouth.     atorvastatin (LIPITOR) 80 MG tablet Take 1 tablet (80 mg total) by mouth daily. DUE FOR CPE APPT JUNE 90 tablet 0   hydrochlorothiazide (HYDRODIURIL) 25 MG tablet Take 1 tablet (25 mg total) by mouth daily. MUST KEEP APPT FOR FURTHER REFILLS 90 tablet 3   lisinopril (ZESTRIL) 40 MG tablet Take 1 tablet (40 mg total) by mouth daily. MUST KEEP APPT FOR FURTHER REFILLS 90 tablet 3   Multiple Vitamins-Minerals (MULTIVITAMIN MEN  50+ PO) Take by mouth daily.     Multiple Vitamins-Minerals (ZINC PO) Take by mouth daily.     omeprazole (PRILOSEC) 40 MG capsule TAKE 1 CAPSULE (40 MG TOTAL) BY MOUTH DAILY. 30 capsule 0   sildenafil (REVATIO) 20 MG tablet TAKE 1 - 5 TABS BY MOUTH DAILY 30 TO 60 MINUTES PRIOR TO INTERCOURSE AS NEEDED. 90 tablet 0   VITAMIN D PO Take by mouth daily.     No facility-administered medications prior to visit.    PAST MEDICAL HISTORY: Past Medical History:  Diagnosis Date   Carotid artery dissection (HCC)    left; spontaneous   Colon cancer screening 01/2019   Cologuard NEG 2021 and 2024   Sullivan Lone disease    History of pneumonia    History of shingles    Hypercholesterolemia    great response to atorva 2021   Hypertension    IFG (impaired fasting glucose)    108 07/2018 f/u visit.  Check A1c with NEXT f/u labs.   Obesity    OSA on CPAP    Dx'd by home sleep study 04/2014.  On auto PAP since that time; most recent compliance monitoring 07/20/14 [facility] [place], [place]).  Compliance monitoring 12/2017 by Dr. Frances Furbish with [facility].   Stroke (HCC) 04/2014   difficulty with word search, tightness in R arm.  Some residual spasticity in R arm.  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    PAST SURGICAL HISTORY: Past Surgical History:  Procedure Laterality Date   CAROTID STENT Left 08/23/2014   L carotid stent widely patent on carotid u/s by Dr. Allyson Sabal 02/09/16--repeat 1 yr same 12/2017.   CHOLECYSTECTOMY  2006?   ELECTROCARDIOGRAM  04/22/2014   [facility] [place]   HOME SLEEP STUDY  04/2014   Home sleep study: mild OSA   VASECTOMY     WISDOM TOOTH EXTRACTION      FAMILY HISTORY: Family History  Problem Relation Age of Onset   Hypertension Mother    Diabetes Mother    Stroke Mother 68       TIA's   Dementia Mother    Atrial fibrillation Mother        stent placement   Alzheimer's  disease Mother    Sleep apnea Mother    Hypertension Father    Diabetes Father    Hypertension Sister    Diabetes Sister    Thyroid disease Sister    Sleep apnea Sister    Breast cancer Maternal Aunt 39   Kidney disease Maternal Grandmother        Dialysis   CVA Maternal Grandmother    Thyroid disease Maternal Grandmother    Congestive Heart Failure Maternal Grandfather    CVA Maternal Grandfather    Deep vein thrombosis Paternal Grandmother    Asthma Daughter     SOCIAL HISTORY: Social History   Socioeconomic History   Marital status: Married    Spouse name: Not on file   Number of children: Not on file   Years of education: Not on file   Highest education level: Not on file  Occupational History   Not on file  Tobacco Use   Smoking status: Former    Current packs/day: 0.00    Types: Cigarettes    Quit date: 11/17/1996    Years since quitting: 26.4   Smokeless tobacco: Former  Building services engineer status: Never Used  Substance and Sexual Activity   Alcohol use: No   Drug use: No   Sexual activity: Not on file  Other Topics Concern   Not on file  Social History Narrative   Married, 4 kids.   Moved from [place] to [facility] 08/2016.   Educ: masters degree   Occup: Corporate investment banker.   Tob: 5 pack yr hx, quit about 1998.   No alcohol or drugs.   Social Drivers of Corporate investment banker Strain: Unknown (03/26/2018)   Received from [facility] [place] [facility] [place], [employer] [facility] [place] and [employer]    Overall Financial Strain: 99    Skipped Doctor's Visit: 3    Skipped Medication due to cost: 3    Utility Shut-offs: 3  Food Insecurity: Not on file  Transportation Needs: Not on file  Physical Activity: Not on file  Stress: Not on file  Social Connections: Not on file  Intimate Partner Violence: Not on file      PHYSICAL EXAM Generalized: Well developed, in no  acute distress   Neurological examination  Mentation: Alert oriented to time, place, history taking. Follows all commands speech and language fluent Cranial nerve II-XII:Extraocular movements were full. Facial symmetry noted. uvula tongue midline. Head turning and shoulder shrug  were normal and symmetric. Motor: Good strength throughout subjectively per patient Sensory: Sensory testing is intact to soft touch on all 4  extremities subjectively per patient Coordination: Cerebellar testing reveals good finger-nose-finger  Gait and station: Patient is able to stand from a seated position. gait is normal.  Reflexes: UTA  DIAGNOSTIC DATA (LABS, IMAGING, TESTING) - I reviewed patient records, labs, notes, testing and imaging myself where available.  Lab Results  Component Value Date   WBC 8.5 02/09/2023   HGB 17.0 02/09/2023   HCT 49.1 02/09/2023   MCV 91.0 02/09/2023   PLT 223.0 02/09/2023      Component Value Date/Time   NA 138 01/26/2023 0000   K 3.7 01/26/2023 0000   CL 104 01/26/2023 0000   CO2 26 (A) 01/26/2023 0000   GLUCOSE 105 (H) 07/01/2022 0859   BUN 17 01/26/2023 0000   CREATININE 1.2 01/26/2023 0000   CREATININE 0.96 07/01/2022 0859   CALCIUM 9.8 07/01/2022 0859   PROT 7.2 07/01/2022 0859   ALBUMIN 5.0 01/26/2023 0000   AST 25 01/26/2023 0000   ALT 47 (A) 01/26/2023 0000   ALKPHOS 79 01/26/2023 0000   BILITOT 1.6 (H) 07/01/2022 0859   Lab Results  Component Value Date   CHOL 121 07/01/2022   HDL 47.40 07/01/2022   LDLCALC 49 07/01/2022   LDLDIRECT 140.0 07/11/2020   TRIG 124.0 07/01/2022   CHOLHDL 3 07/01/2022   Lab Results  Component Value Date   HGBA1C 5.1 02/21/2019   No results found for: "VITAMINB12" Lab Results  Component Value Date   TSH 1.33 07/01/2022      ASSESSMENT AND PLAN 58 y.o. year old male  has a past medical history of Carotid artery dissection (HCC), Colon cancer screening (01/2019), Sullivan Lone disease, History of pneumonia, History  of shingles, Hypercholesterolemia, Hypertension, IFG (impaired fasting glucose), Obesity, OSA on CPAP, and Stroke (HCC) (04/2014). here with:  OSA on CPAP  CPAP compliance excellent Residual AHI is good Encouraged patient to continue using CPAP nightly and > 4 hours each night F/U in 1 year or sooner if needed    Butch Penny, MSN, NP-C 04/20/2023, 12:59 PM [facility] [address], [address] [place], [place] [address] [phone]

## 2023-04-22 DIAGNOSIS — G4733 Obstructive sleep apnea (adult) (pediatric): Secondary | ICD-10-CM | POA: Diagnosis not present

## 2023-04-27 ENCOUNTER — Telehealth: Payer: Self-pay | Admitting: Adult Health

## 2023-04-27 NOTE — Telephone Encounter (Signed)
 LVM and sent mychart msg informing pt of need to reschedule 09/09/23 appt - NP out

## 2023-04-29 ENCOUNTER — Other Ambulatory Visit: Payer: Self-pay | Admitting: Family Medicine

## 2023-05-02 ENCOUNTER — Other Ambulatory Visit: Payer: Self-pay | Admitting: Family Medicine

## 2023-05-09 ENCOUNTER — Telehealth: Admitting: Physician Assistant

## 2023-05-09 DIAGNOSIS — J019 Acute sinusitis, unspecified: Secondary | ICD-10-CM

## 2023-05-09 DIAGNOSIS — B9789 Other viral agents as the cause of diseases classified elsewhere: Secondary | ICD-10-CM

## 2023-05-10 MED ORDER — AZELASTINE HCL 0.1 % NA SOLN: 1.0000 | Freq: Two times a day (BID) | NASAL | 0 refills | Status: DC

## 2023-05-10 NOTE — Progress Notes (Signed)
E-Visit for Sinus Problems  We are sorry that you are not feeling well.  Here is how we plan to help!  Based on what you have shared with me it looks like you have sinusitis.  Sinusitis is inflammation and infection in the sinus cavities of the head.  Based on your presentation I believe you most likely have Acute Viral Sinusitis.This is an infection most likely caused by a virus. There is not specific treatment for viral sinusitis other than to help you with the symptoms until the infection runs its course.  You may use an oral decongestant such as Mucinex D or if you have glaucoma or high blood pressure use plain Mucinex. Saline nasal spray help and can safely be used as often as needed for congestion, I have prescribed: Azelastine nasal spray 2 sprays in each nostril twice a day  Some authorities believe that zinc sprays or the use of Echinacea may shorten the course of your symptoms.  Sinus infections are not as easily transmitted as other respiratory infection, however we still recommend that you avoid close contact with loved ones, especially the very young and elderly.  Remember to wash your hands thoroughly throughout the day as this is the number one way to prevent the spread of infection!  Home Care: Only take medications as instructed by your medical team. Do not take these medications with alcohol. A steam or ultrasonic humidifier can help congestion.  You can place a towel over your head and breathe in the steam from hot water coming from a faucet. Avoid close contacts especially the very young and the elderly. Cover your mouth when you cough or sneeze. Always remember to wash your hands.  Get Help Right Away If: You develop worsening fever or sinus pain. You develop a severe head ache or visual changes. Your symptoms persist after you have completed your treatment plan.  Make sure you Understand these instructions. Will watch your condition. Will get help right away if you are  not doing well or get worse.   Thank you for choosing an e-visit.  Your e-visit answers were reviewed by a board certified advanced clinical practitioner to complete your personal care plan. Depending upon the condition, your plan could have included both over the counter or prescription medications.  Please review your pharmacy choice. Make sure the pharmacy is open so you can pick up prescription now. If there is a problem, you may contact your provider through MyChart messaging and have the prescription routed to another pharmacy.  Your safety is important to us. If you have drug allergies check your prescription carefully.   For the next 24 hours you can use MyChart to ask questions about today's visit, request a non-urgent call back, or ask for a work or school excuse. You will get an email in the next two days asking about your experience. I hope that your e-visit has been valuable and will speed your recovery.  I have spent 5 minutes in review of e-visit questionnaire, review and updating patient chart, medical decision making and response to patient.   Allura Doepke M Haelie Clapp, PA-C  

## 2023-05-13 DIAGNOSIS — R1084 Generalized abdominal pain: Secondary | ICD-10-CM | POA: Diagnosis not present

## 2023-05-13 DIAGNOSIS — R200 Anesthesia of skin: Secondary | ICD-10-CM | POA: Diagnosis not present

## 2023-05-13 DIAGNOSIS — K920 Hematemesis: Secondary | ICD-10-CM | POA: Diagnosis not present

## 2023-05-22 ENCOUNTER — Other Ambulatory Visit: Payer: Self-pay | Admitting: Family Medicine

## 2023-05-23 DIAGNOSIS — G4733 Obstructive sleep apnea (adult) (pediatric): Secondary | ICD-10-CM | POA: Diagnosis not present

## 2023-06-03 ENCOUNTER — Other Ambulatory Visit: Payer: Self-pay | Admitting: Family Medicine

## 2023-06-13 ENCOUNTER — Encounter: Payer: Self-pay | Admitting: Family Medicine

## 2023-06-14 ENCOUNTER — Ambulatory Visit: Admitting: Family Medicine

## 2023-06-14 ENCOUNTER — Encounter: Payer: Self-pay | Admitting: Family Medicine

## 2023-06-14 VITALS — BP 134/80 | HR 81 | Temp 98.0°F | Ht 68.5 in | Wt 220.0 lb

## 2023-06-14 DIAGNOSIS — R935 Abnormal findings on diagnostic imaging of other abdominal regions, including retroperitoneum: Secondary | ICD-10-CM

## 2023-06-14 DIAGNOSIS — I10 Essential (primary) hypertension: Secondary | ICD-10-CM | POA: Diagnosis not present

## 2023-06-14 DIAGNOSIS — T783XXD Angioneurotic edema, subsequent encounter: Secondary | ICD-10-CM | POA: Diagnosis not present

## 2023-06-14 DIAGNOSIS — Z125 Encounter for screening for malignant neoplasm of prostate: Secondary | ICD-10-CM | POA: Diagnosis not present

## 2023-06-14 DIAGNOSIS — E7800 Pure hypercholesterolemia, unspecified: Secondary | ICD-10-CM | POA: Diagnosis not present

## 2023-06-14 DIAGNOSIS — Z0000 Encounter for general adult medical examination without abnormal findings: Secondary | ICD-10-CM | POA: Diagnosis not present

## 2023-06-14 DIAGNOSIS — K921 Melena: Secondary | ICD-10-CM

## 2023-06-14 MED ORDER — ATORVASTATIN CALCIUM 80 MG PO TABS: 80.0000 mg | ORAL_TABLET | Freq: Every day | ORAL | 3 refills | Status: AC

## 2023-06-14 MED ORDER — HYDROCHLOROTHIAZIDE 25 MG PO TABS: 25.0000 mg | ORAL_TABLET | Freq: Every day | ORAL | 3 refills | Status: AC

## 2023-06-14 MED ORDER — OMEPRAZOLE 40 MG PO CPDR: 40.0000 mg | DELAYED_RELEASE_CAPSULE | Freq: Every day | ORAL | 3 refills | Status: DC

## 2023-06-14 MED ORDER — AMLODIPINE BESYLATE 5 MG PO TABS
5.0000 mg | ORAL_TABLET | Freq: Every day | ORAL | 3 refills | Status: AC
Start: 2023-06-14 — End: ?

## 2023-06-14 MED ORDER — LISINOPRIL 40 MG PO TABS: 40.0000 mg | ORAL_TABLET | Freq: Every day | ORAL | 3 refills | Status: AC

## 2023-06-14 MED ORDER — EPINEPHRINE 0.3 MG/0.3ML IJ SOAJ: 0.3000 mg | INTRAMUSCULAR | 1 refills | Status: AC | PRN

## 2023-06-14 NOTE — Progress Notes (Signed)
 OFFICE VISIT  06/14/2023  CC:  Chief Complaint  Patient presents with   Annual Exam    Pt is not fasting    Patient is a 58 y.o. male who presents for annual health maintenance exam and follow-up hypertension and hypercholesterolemia. I last saw him 02/09/2023. A/P as of that visit: "1 acute gastritis, enteritis, and now hematochezia. Fortunately all of the symptoms have resolved he just has a bit of continuing red blood mixed with his stool. CT with contrast findings " distention of the stomach suggesting luminal narrowing/outlet obstruction". No symptoms to correlate with this now. I'll start omeprazole  40 mg every day and will do H pylori breath test. He does not take NSAIDs. Will do stool culture as well as C. difficile PCR.  Will refer to gastroenterology. Recheck CBC today to follow the mild leukocytosis he had at his presentation in the ER."  HPI: All labs came back normal last visit. [facility]. Their plan was to get EGD and colonoscopy. However, he took the colonoscopy prep (Sutab--> sodium sulfate, magnesium sulfate, and potassium chloride) and within 30 minutes had diffuse hives and swelling of his tongue and felt his throat swelling up.  He took antihistamine. Approximately 1 month later they gave him the polyethylene glycol liquid for prep and he had the same reaction.  Interestingly, he says he has not had any gastrointestinal problems since his initial illness back in January this year.  He is interested in getting coronary calcium  imaging.  Past Medical History:  Diagnosis Date   Carotid artery dissection (HCC)    left; spontaneous   Colon cancer screening 01/2019   Cologuard NEG 2021 and 2024   Oletta Berry disease    History of pneumonia    History of shingles    Hypercholesterolemia    great response to atorva 2021   Hypertension    IFG (impaired fasting glucose)    108 07/2018 f/u visit.  Check A1c with  NEXT f/u labs.   Obesity    OSA on CPAP    Dx'd by home sleep study 04/2014.  On auto PAP since that time; most recent compliance monitoring 07/20/14 ([facility] [place]  Compliance monitoring 12/2017 by Dr. Omar Bibber with [facility].   Stroke (HCC) 04/2014   difficulty with word search, tightness in R arm.  Some residual spasticity in R arm.  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    Past Surgical History:  Procedure Laterality Date   CAROTID STENT Left 08/23/2014   L carotid stent widely patent on carotid u/s by Dr. Katheryne Pane 02/09/16--repeat 1 yr same 12/2017.   CHOLECYSTECTOMY  2006?   ELECTROCARDIOGRAM  04/22/2014   [facility] [place]   HOME SLEEP STUDY  04/2014   Home sleep study: mild OSA   VASECTOMY     WISDOM TOOTH EXTRACTION      Outpatient Medications Prior to Visit  Medication Sig Dispense Refill   Ascorbic Acid (VITAMIN C PO) Take by mouth daily.     aspirin EC 81 MG tablet Take 81 mg 2 (two) times daily by mouth.     azelastine  (ASTELIN ) 0.1 % nasal spray Place 1 spray into both nostrils 2 (two) times daily. Use in each nostril as directed 30 mL 0   Multiple Vitamins-Minerals (MULTIVITAMIN MEN 50+ PO) Take by mouth daily.     Multiple Vitamins-Minerals (ZINC PO) Take by mouth daily.  sildenafil  (REVATIO ) 20 MG tablet TAKE 1 - 5 TABS BY MOUTH DAILY 30 TO 60 MINUTES PRIOR TO INTERCOURSE AS NEEDED. 90 tablet 0   VITAMIN D PO Take by mouth daily.     amLODipine  (NORVASC ) 5 MG tablet Take 1 tablet (5 mg total) by mouth daily. OFFICE VISIT NEEDED FOR FURTHER REFILLS 30 tablet 0   atorvastatin  (LIPITOR) 80 MG tablet Take 1 tablet (80 mg total) by mouth daily. DUE FOR CPE APPT JUNE 90 tablet 0   hydrochlorothiazide  (HYDRODIURIL ) 25 MG tablet Take 1 tablet (25 mg total) by mouth daily. OFFICE VISIT NEEDED FOR FURTHER REFILLS 30 tablet 0   lisinopril  (ZESTRIL ) 40 MG tablet TAKE 1  TABLET (40 MG TOTAL) BY MOUTH DAILY. MUST KEEP APPT FOR FURTHER REFILLS 30 tablet 0   omeprazole  (PRILOSEC) 40 MG capsule TAKE 1 CAPSULE (40 MG TOTAL) BY MOUTH DAILY. 30 capsule 0   No facility-administered medications prior to visit.    Allergies  Allergen Reactions   Magnesium Sulfate-Potassium Cl-Sodium Sulfate Hives, Itching and Nausea And Vomiting   Polyethylene Glycol 3350 Hives    Review of Systems  As per HPI  PE:    06/14/2023    3:37 PM 02/09/2023    9:03 AM 09/08/2022    8:21 AM  Vitals with BMI  Height 5' 8.5"  5\' 7"   Weight 220 lbs 214 lbs 6 oz 216 lbs  BMI 32.96  33.82  Systolic 134 123 401  Diastolic 80 85 79  Pulse 81 60 65   Physical Exam  Gen: Alert, well appearing.  Patient is oriented to person, place, time, and situation. AFFECT: pleasant, lucid thought and speech. ENT: Ears: EACs clear, normal epithelium.  TMs with good light reflex and landmarks bilaterally.  Eyes: no injection, icteris, swelling, or exudate.  EOMI, PERRLA. Nose: no drainage or turbinate edema/swelling.  No injection or focal lesion.  Mouth: lips without lesion/swelling.  Oral mucosa pink and moist.  Dentition intact and without obvious caries or gingival swelling.  Oropharynx without erythema, exudate, or swelling.  Neck: supple/nontender.  No LAD, mass, or TM.  Carotid pulses 2+ bilaterally, without bruits. CV: RRR, no m/r/g.   LUNGS: CTA bilat, nonlabored resps, good aeration in all lung fields. ABD: soft, NT, ND, BS normal.  No hepatospenomegaly or mass.  No bruits. EXT: no clubbing, cyanosis, or edema.  Musculoskeletal: no joint swelling, erythema, warmth, or tenderness.  ROM of all joints intact. Skin - no sores or suspicious lesions or rashes or color changes   LABS:  Last CBC Lab Results  Component Value Date   WBC 8.5 02/09/2023   HGB 17.0 02/09/2023   HCT 49.1 02/09/2023   MCV 91.0 02/09/2023   RDW 13.3 02/09/2023   PLT 223.0 02/09/2023   Last metabolic panel Lab  Results  Component Value Date   GLUCOSE 105 (H) 07/01/2022   NA 138 01/26/2023   K 3.7 01/26/2023   CL 104 01/26/2023   CO2 26 (A) 01/26/2023   BUN 17 01/26/2023   CREATININE 1.2 01/26/2023   EGFR 75 01/26/2023   CALCIUM  9.8 07/01/2022   PROT 7.2 07/01/2022   ALBUMIN 5.0 01/26/2023   BILITOT 1.6 (H) 07/01/2022   ALKPHOS 79 01/26/2023   AST 25 01/26/2023   ALT 47 (A) 01/26/2023   Last lipids Lab Results  Component Value Date   CHOL 121 07/01/2022   HDL 47.40 07/01/2022   LDLCALC 49 07/01/2022   LDLDIRECT 140.0 07/11/2020   TRIG 124.0  07/01/2022   CHOLHDL 3 07/01/2022   Lab Results  Component Value Date   PSA 1.13 07/01/2022   PSA 0.69 07/11/2020   PSA 0.78 02/21/2019   Lab Results  Component Value Date   HGBA1C 5.1 02/21/2019   IMPRESSION AND PLAN:  1 health maintenance exam: Reviewed age and gender appropriate health maintenance issues (prudent diet, regular exercise, health risks of tobacco and excessive alcohol, use of seatbelts, fire alarms in home, use of sunscreen).  Also reviewed age and gender appropriate health screening as well as vaccine recommendations. Vaccines: ALL UTD. Labs: HP labs+ PSA--> future when fasting Prostate ca screening: PSA today. Colon ca screening: cologuard neg 07/2022.  #2 hypertension, well-controlled on HCTZ 25 mg a day and lisinopril  40 mg a day.  3.  Hypercholesterolemia, well-controlled on a atorvastatin  80 mg a day. Return for fasting lipid panel.  #4 angioedema. He reports first episode of angioedema with Sutab bowel prep, followed by his second episode when he took polyethylene glycol bowel prep approximately a month later. Refer to allergist. He has antihistamine to take as needed.  Will prescribe EpiPen  to have on hand.  5.  Risk stratification for coronary artery disease. We discussed the utility of coronary calcium  testing.  Although he does not fit the appropriate criteria for this to be a useful test he still is  considering requesting it.  He will let me know via MyChart message and I will be glad to order it if he chooses. He currently takes aspirin 81 mg a day, has well-controlled blood pressure, and takes statin daily. He is asymptomatic from a cardiovascular standpoint.  6.  History of acute gastritis, enteritis, hematochezia 01/2023. CT with contrast findings at that time showed" distention of the stomach suggesting luminal narrowing/outlet obstruction". No symptoms to correlate with this now. He saw [facility] and had problems with prep as detailed above. He requests referral to a different gastroenterology practice.-->  Ordered today.  An After Visit Summary was printed and given to the patient.  FOLLOW UP: Return in about 6 months (around 12/15/2023) for routine chronic illness f/u.  Signed:  Arletha Lady, MD           06/14/2023

## 2023-06-14 NOTE — Patient Instructions (Signed)
 Health Maintenance, Male  Adopting a healthy lifestyle and getting preventive care are important in promoting health and wellness. Ask your health care provider about:  The right schedule for you to have regular tests and exams.  Things you can do on your own to prevent diseases and keep yourself healthy.  What should I know about diet, weight, and exercise?  Eat a healthy diet    Eat a diet that includes plenty of vegetables, fruits, low-fat dairy products, and lean protein.  Do not eat a lot of foods that are high in solid fats, added sugars, or sodium.  Maintain a healthy weight  Body mass index (BMI) is a measurement that can be used to identify possible weight problems. It estimates body fat based on height and weight. Your health care provider can help determine your BMI and help you achieve or maintain a healthy weight.  Get regular exercise  Get regular exercise. This is one of the most important things you can do for your health. Most adults should:  Exercise for at least 150 minutes each week. The exercise should increase your heart rate and make you sweat (moderate-intensity exercise).  Do strengthening exercises at least twice a week. This is in addition to the moderate-intensity exercise.  Spend less time sitting. Even light physical activity can be beneficial.  Watch cholesterol and blood lipids  Have your blood tested for lipids and cholesterol at 56 years of age, then have this test every 5 years.  You may need to have your cholesterol levels checked more often if:  Your lipid or cholesterol levels are high.  You are older than 56 years of age.  You are at high risk for heart disease.  What should I know about cancer screening?  Many types of cancers can be detected early and may often be prevented. Depending on your health history and family history, you may need to have cancer screening at various ages. This may include screening for:  Colorectal cancer.  Prostate cancer.  Skin cancer.  Lung  cancer.  What should I know about heart disease, diabetes, and high blood pressure?  Blood pressure and heart disease  High blood pressure causes heart disease and increases the risk of stroke. This is more likely to develop in people who have high blood pressure readings or are overweight.  Talk with your health care provider about your target blood pressure readings.  Have your blood pressure checked:  Every 3-5 years if you are 9-95 years of age.  Every year if you are 85 years old or older.  If you are between the ages of 29 and 29 and are a current or former smoker, ask your health care provider if you should have a one-time screening for abdominal aortic aneurysm (AAA).  Diabetes  Have regular diabetes screenings. This checks your fasting blood sugar level. Have the screening done:  Once every three years after age 23 if you are at a normal weight and have a low risk for diabetes.  More often and at a younger age if you are overweight or have a high risk for diabetes.  What should I know about preventing infection?  Hepatitis B  If you have a higher risk for hepatitis B, you should be screened for this virus. Talk with your health care provider to find out if you are at risk for hepatitis B infection.  Hepatitis C  Blood testing is recommended for:  Everyone born from [birth year] through 1963.  Anyone  with known risk factors for hepatitis C.  Sexually transmitted infections (STIs)  You should be screened each year for STIs, including gonorrhea and chlamydia, if:  You are sexually active and are younger than 56 years of age.  You are older than 56 years of age and your health care provider tells you that you are at risk for this type of infection.  Your sexual activity has changed since you were last screened, and you are at increased risk for chlamydia or gonorrhea. Ask your health care provider if you are at risk.  Ask your health care provider about whether you are at high risk for HIV. Your health care provider  may recommend a prescription medicine to help prevent HIV infection. If you choose to take medicine to prevent HIV, you should first get tested for HIV. You should then be tested every 3 months for as long as you are taking the medicine.  Follow these instructions at home:  Alcohol use  Do not drink alcohol if your health care provider tells you not to drink.  If you drink alcohol:  Limit how much you have to 0-2 drinks a day.  Know how much alcohol is in your drink. In the [place] one drink equals one 12 oz bottle of beer (355 mL), one 5 oz glass of wine (148 mL), or one 1 oz glass of hard liquor (44 mL).  Lifestyle  Do not use any products that contain nicotine or tobacco. These products include cigarettes, chewing tobacco, and vaping devices, such as e-cigarettes. If you need help quitting, ask your health care provider.  Do not use street drugs.  Do not share needles.  Ask your health care provider for help if you need support or information about quitting drugs.  General instructions  Schedule regular health, dental, and eye exams.  Stay current with your vaccines.  Tell your health care provider if:  You often feel depressed.  You have ever been abused or do not feel safe at home.  Summary  Adopting a healthy lifestyle and getting preventive care are important in promoting health and wellness.  Follow your health care provider's instructions about healthy diet, exercising, and getting tested or screened for diseases.  Follow your health care provider's instructions on monitoring your cholesterol and blood pressure.  This information is not intended to replace advice given to you by your health care provider. Make sure you discuss any questions you have with your health care provider.  Document Revised: 06/03/2020 Document Reviewed: 06/03/2020  Elsevier Patient Education  2024 [employer].

## 2023-06-16 ENCOUNTER — Other Ambulatory Visit (INDEPENDENT_AMBULATORY_CARE_PROVIDER_SITE_OTHER)

## 2023-06-16 ENCOUNTER — Ambulatory Visit: Payer: Self-pay | Admitting: Family Medicine

## 2023-06-16 DIAGNOSIS — I10 Essential (primary) hypertension: Secondary | ICD-10-CM

## 2023-06-16 DIAGNOSIS — E7800 Pure hypercholesterolemia, unspecified: Secondary | ICD-10-CM

## 2023-06-16 DIAGNOSIS — Z125 Encounter for screening for malignant neoplasm of prostate: Secondary | ICD-10-CM | POA: Diagnosis not present

## 2023-06-16 DIAGNOSIS — R7301 Impaired fasting glucose: Secondary | ICD-10-CM

## 2023-06-16 LAB — CBC WITH DIFFERENTIAL/PLATELET
Basophils Absolute: 0.1 10*3/uL (ref 0.0–0.1)
Basophils Relative: 0.9 % (ref 0.0–3.0)
Eosinophils Absolute: 0.2 10*3/uL (ref 0.0–0.7)
Eosinophils Relative: 3.2 % (ref 0.0–5.0)
HCT: 44.8 % (ref 39.0–52.0)
Hemoglobin: 15.7 g/dL (ref 13.0–17.0)
Lymphocytes Relative: 30.2 % (ref 12.0–46.0)
Lymphs Abs: 2.1 10*3/uL (ref 0.7–4.0)
MCHC: 35 g/dL (ref 30.0–36.0)
MCV: 88.3 fl (ref 78.0–100.0)
Monocytes Absolute: 0.5 10*3/uL (ref 0.1–1.0)
Monocytes Relative: 7 % (ref 3.0–12.0)
Neutro Abs: 4.2 10*3/uL (ref 1.4–7.7)
Neutrophils Relative %: 58.7 % (ref 43.0–77.0)
Platelets: 206 10*3/uL (ref 150.0–400.0)
RBC: 5.07 Mil/uL (ref 4.22–5.81)
RDW: 13.1 % (ref 11.5–15.5)
WBC: 7.1 10*3/uL (ref 4.0–10.5)

## 2023-06-16 LAB — COMPREHENSIVE METABOLIC PANEL WITH GFR
ALT: 40 U/L (ref 0–53)
AST: 20 U/L (ref 0–37)
Albumin: 4.5 g/dL (ref 3.5–5.2)
Alkaline Phosphatase: 69 U/L (ref 39–117)
BUN: 20 mg/dL (ref 6–23)
CO2: 27 meq/L (ref 19–32)
Calcium: 9.4 mg/dL (ref 8.4–10.5)
Chloride: 107 meq/L (ref 96–112)
Creatinine, Ser: 0.96 mg/dL (ref 0.40–1.50)
GFR: 88.73 mL/min (ref >60.00)
Glucose, Bld: 110 mg/dL — ABNORMAL HIGH (ref 70–99)
Potassium: 3.8 meq/L (ref 3.5–5.1)
Sodium: 143 meq/L (ref 135–145)
Total Bilirubin: 1.1 mg/dL (ref 0.2–1.2)
Total Protein: 6.6 g/dL (ref 6.0–8.3)

## 2023-06-16 LAB — LIPID PANEL
Cholesterol: 135 mg/dL (ref 0–200)
HDL: 44.7 mg/dL (ref >39.00)
LDL Cholesterol: 68 mg/dL (ref 0–99)
NonHDL: 90.7
Total CHOL/HDL Ratio: 3
Triglycerides: 113 mg/dL (ref 0.0–149.0)
VLDL: 22.6 mg/dL (ref 0.0–40.0)

## 2023-06-16 LAB — TSH: TSH: 1.99 u[IU]/mL (ref 0.35–5.50)

## 2023-06-16 LAB — PSA: PSA: 0.6 ng/mL (ref 0.10–4.00)

## 2023-06-17 ENCOUNTER — Ambulatory Visit: Payer: Self-pay | Admitting: Family Medicine

## 2023-06-17 ENCOUNTER — Ambulatory Visit (INDEPENDENT_AMBULATORY_CARE_PROVIDER_SITE_OTHER)

## 2023-06-17 DIAGNOSIS — R7301 Impaired fasting glucose: Secondary | ICD-10-CM | POA: Diagnosis not present

## 2023-06-17 LAB — HEMOGLOBIN A1C: Hgb A1c MFr Bld: 5.7 % (ref 4.6–6.5)

## 2023-07-01 ENCOUNTER — Telehealth: Payer: Self-pay | Admitting: Gastroenterology

## 2023-07-01 NOTE — Telephone Encounter (Signed)
 Good afternoon Dr. General Kenner,   DOD PM 6/5  We received a referral for patient to be seen for Abnormal CT of the abdomen and Hematochezia. Patient last saw [facility] in January. Patient is requesting to transfer due to being prescribed medication by [facility] and was allergic to it. States [facility] provided a different form of medication and he had a second allergic reaction. Patient states he is uncomfortable returning. Patient's previous records are in Epic for you to review and advise on scheduling.   Thank you.

## 2023-07-01 NOTE — Telephone Encounter (Signed)
 We can see him for new patient visit in the office

## 2023-07-08 NOTE — Telephone Encounter (Signed)
 Left patient voicemail to call and schedule office visit.

## 2023-07-17 DIAGNOSIS — G4733 Obstructive sleep apnea (adult) (pediatric): Secondary | ICD-10-CM | POA: Diagnosis not present

## 2023-07-22 ENCOUNTER — Ambulatory Visit: Admitting: Allergy

## 2023-08-16 DIAGNOSIS — G4733 Obstructive sleep apnea (adult) (pediatric): Secondary | ICD-10-CM | POA: Diagnosis not present

## 2023-09-09 ENCOUNTER — Telehealth: Payer: BC Managed Care – PPO | Admitting: Adult Health

## 2023-09-16 DIAGNOSIS — G4733 Obstructive sleep apnea (adult) (pediatric): Secondary | ICD-10-CM | POA: Diagnosis not present

## 2023-12-07 ENCOUNTER — Encounter: Payer: Self-pay | Admitting: Family Medicine

## 2023-12-07 ENCOUNTER — Ambulatory Visit (INDEPENDENT_AMBULATORY_CARE_PROVIDER_SITE_OTHER): Admitting: Family Medicine

## 2023-12-07 VITALS — BP 116/77 | HR 61 | Temp 97.6°F | Ht 68.5 in | Wt 218.4 lb

## 2023-12-07 DIAGNOSIS — I10 Essential (primary) hypertension: Secondary | ICD-10-CM

## 2023-12-07 DIAGNOSIS — K921 Melena: Secondary | ICD-10-CM

## 2023-12-07 DIAGNOSIS — R935 Abnormal findings on diagnostic imaging of other abdominal regions, including retroperitoneum: Secondary | ICD-10-CM

## 2023-12-07 DIAGNOSIS — T783XXD Angioneurotic edema, subsequent encounter: Secondary | ICD-10-CM | POA: Diagnosis not present

## 2023-12-07 DIAGNOSIS — E7800 Pure hypercholesterolemia, unspecified: Secondary | ICD-10-CM | POA: Diagnosis not present

## 2023-12-07 NOTE — Addendum Note (Signed)
 Addended by: FLETA CARE D on: 12/07/2023 11:23 AM   Modules accepted: Orders

## 2023-12-07 NOTE — Progress Notes (Signed)
 OFFICE VISIT  12/07/2023  CC:  Chief Complaint  Patient presents with   Medical Management of Chronic Issues    Patient is a 58 y.o. male who presents for follow-up hypertension and hypercholesterolemia. A/P as of last visit: 1 hypertension, well-controlled on HCTZ 25 mg a day and lisinopril  40 mg a day.   2.  Hypercholesterolemia, well-controlled on a atorvastatin  80 mg a day. Return for fasting lipid panel.   3. angioedema. He reports first episode of angioedema with Sutab bowel prep, followed by his second episode when he took polyethylene glycol bowel prep approximately a month later. Refer to allergist. He has antihistamine to take as needed.  Will prescribe EpiPen  to have on hand.   4.  Risk stratification for coronary artery disease. We discussed the utility of coronary calcium  testing.  Although he does not fit the appropriate criteria for this to be a useful test he still is considering requesting it.  He will let me know via MyChart message and I will be glad to order it if he chooses. He currently takes aspirin 81 mg a day, has well-controlled blood pressure, and takes statin daily. He is asymptomatic from a cardiovascular standpoint.   5.  History of acute gastritis, enteritis, hematochezia 01/2023. CT with contrast findings at that time showed distention of the stomach suggesting luminal narrowing/outlet obstruction. No symptoms to correlate with this now. He saw [facility] and had problems with prep as detailed above. He requests referral to a different gastroenterology practice.-->  Ordered today.  INTERIM HX: Mark Gilmore feels well. His dad passed away this summer and this has had him quite distracted from his own health. He has not made his GI or allergy appointments. He has not contemplated coronary calcium  testing any further.  He is compliant with all his medications.    Past Medical History:  Diagnosis Date   Carotid artery dissection    left; spontaneous    Colon cancer screening 01/2019   Cologuard NEG 2021 and 2024   Bertrum disease    History of pneumonia    History of shingles    Hypercholesterolemia    great response to atorva 2021   Hypertension    IFG (impaired fasting glucose)    108 07/2018 f/u visit.  Check A1c with NEXT f/u labs.   Obesity    OSA on CPAP    Dx'd by home sleep study 04/2014.  On auto PAP since that time; most recent compliance monitoring 07/20/14 ([facility], [place], [place]).  Compliance monitoring 12/2017 by Dr. Buck with [facility].   Stroke (HCC) 04/2014   difficulty with word search, tightness in R arm.  Some residual spasticity in R arm.  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    Past Surgical History:  Procedure Laterality Date   CAROTID STENT Left 08/23/2014   L carotid stent widely patent on carotid u/s by Dr. Court 02/09/16--repeat 1 yr same 12/2017.   CHOLECYSTECTOMY  2006?   ELECTROCARDIOGRAM  04/22/2014   [facility]   HOME SLEEP STUDY  04/2014   Home sleep study: mild OSA   VASECTOMY     WISDOM TOOTH EXTRACTION     Social History   Socioeconomic History   Marital status: Married    Spouse name: Not on file   Number of children: Not on file   Years of education: Not on file   Highest education level: Not on file  Occupational  History   Not on file  Tobacco Use   Smoking status: Former    Current packs/day: 0.00    Types: Cigarettes    Quit date: 11/17/1996    Years since quitting: 27.0   Smokeless tobacco: Former  Building Services Engineer status: Never Used  Substance and Sexual Activity   Alcohol use: No   Drug use: No   Sexual activity: Not on file  Other Topics Concern   Not on file  Social History Narrative   Married, 4 kids.   Moved from [place]  to [facility] 08/2016.   Educ: masters degree   Occup: corporate investment banker.   Tob: 5 pack yr hx, quit  about 1998.   No alcohol or drugs.   Social Drivers of Health   Financial Resource Strain: Unknown (03/26/2018)   Received from [facility] [place]  and [place]    Financial Resource Strain    Overall Financial Strain: 99    Skipped Doctor's Visit: 3    Skipped Medication due to cost: 3    Utility Shut-offs: 3  Food Insecurity: Not on file  Transportation Needs: Not on file  Physical Activity: Not on file  Stress: Not on file  Social Connections: Not on file    Outpatient Medications Prior to Visit  Medication Sig Dispense Refill   amLODipine  (NORVASC ) 5 MG tablet Take 1 tablet (5 mg total) by mouth daily. 90 tablet 3   Ascorbic Acid (VITAMIN C PO) Take by mouth daily.     aspirin EC 81 MG tablet Take 81 mg 2 (two) times daily by mouth.     atorvastatin  (LIPITOR) 80 MG tablet Take 1 tablet (80 mg total) by mouth daily. 90 tablet 3   EPINEPHrine  0.3 mg/0.3 mL IJ SOAJ injection Inject 0.3 mg into the muscle as needed for anaphylaxis. 2 each 1   hydrochlorothiazide  (HYDRODIURIL ) 25 MG tablet Take 1 tablet (25 mg total) by mouth daily. 90 tablet 3   lisinopril  (ZESTRIL ) 40 MG tablet Take 1 tablet (40 mg total) by mouth daily. 90 tablet 3   Multiple Vitamins-Minerals (MULTIVITAMIN MEN 50+ PO) Take by mouth daily.     Multiple Vitamins-Minerals (ZINC PO) Take by mouth daily.     omeprazole  (PRILOSEC) 40 MG capsule Take 1 capsule (40 mg total) by mouth daily. 90 capsule 3   sildenafil  (REVATIO ) 20 MG tablet TAKE 1 - 5 TABS BY MOUTH DAILY 30 TO 60 MINUTES PRIOR TO INTERCOURSE AS NEEDED. 90 tablet 0   VITAMIN D PO Take by mouth daily.     azelastine  (ASTELIN ) 0.1 % nasal spray Place 1 spray into both nostrils 2 (two) times daily. Use in each nostril as directed (Patient not taking: Reported on 12/07/2023) 30 mL 0   No facility-administered medications prior to visit.    Allergies  Allergen Reactions   Magnesium Sulfate-Potassium Cl-Sodium Sulfate Hives, Itching and  Nausea And Vomiting   Polyethylene Glycol 3350 Hives    Review of Systems As per HPI  PE:    12/07/2023   10:39 AM 06/14/2023    3:37 PM 02/09/2023    9:03 AM  Vitals with BMI  Height 5' 8.5 5' 8.5   Weight 218 lbs 6 oz 220 lbs 214 lbs 6 oz  BMI 32.72 32.96   Systolic 116 134 876  Diastolic 77 80 85  Pulse 61 81 60     Physical Exam  Gen: Alert, well appearing.  Patient is  oriented to person, place, time, and situation. AFFECT: pleasant, lucid thought and speech. No further exam today  LABS:  Last CBC Lab Results  Component Value Date   WBC 7.1 06/16/2023   HGB 15.7 06/16/2023   HCT 44.8 06/16/2023   MCV 88.3 06/16/2023   RDW 13.1 06/16/2023   PLT 206.0 06/16/2023   Last metabolic panel Lab Results  Component Value Date   GLUCOSE 110 (H) 06/16/2023   NA 143 06/16/2023   K 3.8 06/16/2023   CL 107 06/16/2023   CO2 27 06/16/2023   BUN 20 06/16/2023   CREATININE 0.96 06/16/2023   GFR 88.73 06/16/2023   CALCIUM  9.4 06/16/2023   PROT 6.6 06/16/2023   ALBUMIN 4.5 06/16/2023   BILITOT 1.1 06/16/2023   ALKPHOS 69 06/16/2023   AST 20 06/16/2023   ALT 40 06/16/2023   Last lipids Lab Results  Component Value Date   CHOL 135 06/16/2023   HDL 44.70 06/16/2023   LDLCALC 68 06/16/2023   LDLDIRECT 140.0 07/11/2020   TRIG 113.0 06/16/2023   CHOLHDL 3 06/16/2023   Last hemoglobin A1c Lab Results  Component Value Date   HGBA1C 5.7 06/17/2023   Last thyroid  functions Lab Results  Component Value Date   TSH 1.99 06/16/2023   MPRESSION AND PLAN:  1 hypertension, well-controlled on HCTZ 25 mg a day and lisinopril  40 mg a day. Electrolytes and creatinine normal approximately 6 months ago. Will repeat these labs in 6 months.    2.  Hypercholesterolemia, well-controlled on a atorvastatin  80 mg a day. LDL was 68 approximately 6 months ago. Repeat lipid panel in 6 months.   3. angioedema. He reports first episode of angioedema with Sutab bowel prep,  followed by his second episode when he took polyethylene glycol bowel prep approximately a month later. Referred him to allergist at last visit but he has not been able to make the appointment.  We gave him their contact information again. He has antihistamine to take as needed, has EpiPen  on hand.  4.  Risk stratification for coronary artery disease. We discussed the utility of coronary calcium  testing.  Although he does not fit the appropriate criteria for this to be a useful test he still is considering requesting it.  He will let me know via MyChart message and I will be glad to order it if he chooses. He currently takes aspirin 81 mg a day, has well-controlled blood pressure, and takes statin daily. He is asymptomatic from a cardiovascular standpoint.   5.  History of acute gastritis, enteritis, hematochezia 01/2023. CT with contrast findings at that time showed  distention of the stomach suggesting luminal narrowing/outlet obstruction. No symptoms to correlate with this now. He saw [facility] and had problems with prep as detailed above. He requests referral to a different gastroenterology practice.-->  Ordered 6 months ago but he has not been able to make the appointment.  Gave contact information for GI office today.  An After Visit Summary was printed and given to the patient.  FOLLOW UP: Return in about 6 months (around 06/05/2024) for annual CPE (fasting). Next CPE May 2026 Signed:  Gerlene Hockey, MD           12/07/2023

## 2023-12-14 DIAGNOSIS — G4733 Obstructive sleep apnea (adult) (pediatric): Secondary | ICD-10-CM | POA: Diagnosis not present

## 2023-12-15 ENCOUNTER — Ambulatory Visit: Admitting: Family Medicine

## 2024-01-11 ENCOUNTER — Ambulatory Visit: Admitting: Allergy

## 2024-01-11 ENCOUNTER — Encounter: Payer: Self-pay | Admitting: Allergy

## 2024-01-11 VITALS — BP 106/70 | HR 96 | Temp 97.6°F | Ht 67.0 in | Wt 223.5 lb

## 2024-01-11 DIAGNOSIS — L509 Urticaria, unspecified: Secondary | ICD-10-CM | POA: Diagnosis not present

## 2024-01-11 DIAGNOSIS — T50905A Adverse effect of unspecified drugs, medicaments and biological substances, initial encounter: Secondary | ICD-10-CM | POA: Diagnosis not present

## 2024-01-11 DIAGNOSIS — L270 Generalized skin eruption due to drugs and medicaments taken internally: Secondary | ICD-10-CM

## 2024-01-11 NOTE — Progress Notes (Signed)
 New Patient Note  RE: Mark Gilmore MRN: [id number] DOB: Dec 26, 1965 Date of Office Visit: 01/11/2024  Consult requested by: Candise [name], MD Primary care provider: Candise [name], MD  Chief Complaint: Urticaria (With medications )  History of Present Illness: I had the pleasure of seeing Mark Gilmore for initial evaluation at the [facility] on 01/11/2024. He is a 58 y.o. male, who is referred here by [name], [name], MD for the evaluation of hives.  Discussed the use of AI scribe software for clinical note transcription with the patient, who gave verbal consent to proceed.    Mark Gilmore is a 58 year old male who presents with allergic reactions to bowel prep medications. He was referred by [name] for evaluation of allergic reactions to bowel prep medications.  Earlier this year, he attempted to undergo a colonoscopy and was prescribed Sutab for bowel preparation. Within five to ten minutes of taking the medication, he developed welts all over his body, including his legs, arms, and face. Benadryl resolved the welts within a couple of hours. He did not complete the colonoscopy due to this reaction.  A month later, he was advised to use Miralax as an alternative bowel prep. After consuming about one to two quarts, he experienced a similar reaction with hives appearing within ten to fifteen minutes. Again, Benadryl was effective in resolving the symptoms within fifteen to thirty minutes. He did not proceed with the colonoscopy after this reaction.  He has no prior history of medication allergies, hives, or rashes. No issues with vaccines, injectable medications. No prior exposure to cosmetic fillers. No history of asthma, eczema, or significant environmental allergies, although he notes increased sensitivity to weather changes with age. No known food allergies and no reactions to bee stings.  His current medications include blood pressure  medication, cholesterol medication, aspirin, and a multivitamin. He previously used Prilosec but is no longer taking it. He was prescribed an Epipen  for the above reactions, but he has not filled it.  No recent fevers, chills, or respiratory issues. Normal appetite and bowel movements.   He had normal Cologuard in the past.      Associated symptoms include: itching.  Denies any fevers, chills, foods, personal care products or recent infections. He has tried the following therapies: benadryl with good benefit. Systemic steroids: none.  Previous history of rash/hives: no.   Any history of anaphylaxis to vaccinations, including a COVID19 vaccine? (anaphylaxis >= 2 of the following: flushing, itching, hives, swelling, rhinorrhea, wheezing, abdominal pain, N/V, dizziness,  tachycardia, anxiety, sensation of doom, or any one single serious symptom -see page 2 for more details) No  Any history of reactions to injectable medications? No  Any history of dermal filler treatments in the last year? No  SUTAB (sodium sulfate, magnesium sulfate, potassium chloride) tablets for oral use is an osmotic laxative indicated for cleansing of the colon in preparation for colonoscopy in adults. Tablets: 1.479 g sodium sulfate, 0.225 g magnesium sulfate, and 0.188 g potassium chloride  Miralax  Active Ingredient (in each dose)  Polyethylene glycol 3350, 17 g (osmotic laxative)  12/07/2023 PCP visit: 3. angioedema. He reports first episode of angioedema with Sutab bowel prep, followed by his second episode when he took polyethylene glycol bowel prep approximately a month later. Refer to allergist. He has antihistamine to take as needed.  Will prescribe EpiPen  to have on hand.  Assessment and Plan: Mark Gilmore is a 58 y.o. male with:  Hives Dermatitis due to drug taken internally Adverse effect of drug, initial encounter Patient developed hives after taking sutab (1.479 g sodium sulfate, 0.225 g  magnesium sulfate, and 0.188 g potassium chloride) and miralax (Polyethylene glycol 3350, 17 g) requiring antihistamines. No prior exposures to these medications and no prior history of reactions to medications. No history of hives. Patient was planning on getting colonoscopy. Discussed that there is no bloodwork for this. I question if he had a true allergic reaction vs he had a stress response in the form of hives as he did have diarrhea after taking the medications.  We do have polyethylene glycol skin testing protocol but it's not well studied.  Discuss alternative bowel prep agents with GI doctor which do not contain sodium sulfate, magnesium sulfate, potassium chloride, polyethylene glycol. Consider drug testing/challenge to alternative agent vs skin testing to polyethylene glycol.   Follow up for drug skin testing/challenge depending on GI input.   No orders of the defined types were placed in this encounter.  Lab Orders  No laboratory test(s) ordered today    Other allergy screening: Asthma: no Rhino conjunctivitis: yes Doesn't take any medications for this.  Food allergy: no Hymenoptera allergy: no Eczema:no History of recurrent infections suggestive of immunodeficency: no  Diagnostics: None.   Past Medical History: Patient Active Problem List   Diagnosis Date Noted   Hyperlipidemia 05/16/2019   OSA on CPAP    Obesity    Hypertension    Carotid artery dissection    Stroke (HCC) 04/27/2014   Past Medical History:  Diagnosis Date   Carotid artery dissection    left; spontaneous   Colon cancer screening 01/2019   Cologuard NEG 2021 and 2024   Mark Gilmore disease    History of pneumonia    History of shingles    Hypercholesterolemia    great response to atorva 2021   Hypertension    IFG (impaired fasting glucose)    108 07/2018 f/u visit.  Check A1c with NEXT f/u labs.   Obesity    OSA on CPAP    Dx'd by home sleep study 04/2014.  On auto PAP since that time; most  recent compliance monitoring 07/20/14 ([facility], [place], [place]  Compliance monitoring 12/2017 by Dr. Buck with [facility].   Stroke (HCC) 04/2014   difficulty with word search, tightness in R arm.  Some residual spasticity in R arm.  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   Past Surgical History: Past Surgical History:  Procedure Laterality Date   CAROTID STENT Left 08/23/2014   L carotid stent widely patent on carotid u/s by Dr. Court 02/09/16--repeat 1 yr same 12/2017.   CHOLECYSTECTOMY  2006?   ELECTROCARDIOGRAM  04/22/2014   [facility] [place]   HOME SLEEP STUDY  04/2014   Home sleep study: mild OSA   VASECTOMY     WISDOM TOOTH EXTRACTION     Medication List:  Current Outpatient Medications  Medication Sig Dispense Refill   amLODipine  (NORVASC ) 5 MG tablet Take 1 tablet (5 mg total) by mouth daily. 90 tablet 3   aspirin EC 81 MG tablet Take 81 mg 2 (two) times daily by mouth.     atorvastatin  (LIPITOR) 80 MG tablet Take 1 tablet (80 mg total) by mouth daily. 90 tablet 3   hydrochlorothiazide  (HYDRODIURIL ) 25 MG tablet Take 1 tablet (25 mg total) by mouth daily. 90 tablet 3   lisinopril  (ZESTRIL ) 40  MG tablet Take 1 tablet (40 mg total) by mouth daily. 90 tablet 3   Multiple Vitamins-Minerals (MULTIVITAMIN MEN 50+ PO) Take by mouth daily.     Multiple Vitamins-Minerals (ZINC PO) Take by mouth daily.     VITAMIN D PO Take by mouth daily.     Ascorbic Acid (VITAMIN C PO) Take by mouth daily. (Patient not taking: Reported on 01/11/2024)     EPINEPHrine  0.3 mg/0.3 mL IJ SOAJ injection Inject 0.3 mg into the muscle as needed for anaphylaxis. (Patient not taking: Reported on 01/11/2024) 2 each 1   sildenafil  (REVATIO ) 20 MG tablet TAKE 1 - 5 TABS BY MOUTH DAILY 30 TO 60 MINUTES PRIOR TO INTERCOURSE AS NEEDED. (Patient not taking: Reported on 01/11/2024) 90 tablet 0   No current  facility-administered medications for this visit.   Allergies: Allergies[1] Social History: Social History   Socioeconomic History   Marital status: Married    Spouse name: Not on file   Number of children: Not on file   Years of education: Not on file   Highest education level: Not on file  Occupational History   Not on file  Tobacco Use   Smoking status: Former    Current packs/day: 0.00    Types: Cigarettes    Quit date: 11/17/1996    Years since quitting: 27.1   Smokeless tobacco: Former  Building Services Engineer status: Never Used  Substance and Sexual Activity   Alcohol use: No   Drug use: No   Sexual activity: Not on file  Other Topics Concern   Not on file  Social History Narrative   Married, 4 kids.   Moved from [place]  to [facility] 08/2016.   Educ: masters degree   Occup: corporate investment banker.   Tob: 5 pack yr hx, quit about 1998.   No alcohol or drugs.   Social Drivers of Health   Tobacco Use: Medium Risk (01/11/2024)   Patient History    Smoking Tobacco Use: Former    Smokeless Tobacco Use: Former    Passive Exposure: Not on Stage Manager: Not on file  Food Insecurity: Not on file  Transportation Needs: Not on file  Physical Activity: Not on file  Stress: Not on file  Social Connections: Not on file  Depression (PHQ2-9): Low Risk (12/07/2023)   Depression (PHQ2-9)    PHQ-2 Score: 0  Alcohol Screen: Not on file  Housing: Not on file  Utilities: Not on file  Health Literacy: Not on file   Lives in a house. Smoking: denies Occupation: office work, Oncologist HistorySurveyor, Minerals in the house: no Engineer, Civil (consulting) in the family room: no Carpet in the bedroom: yes Heating: gas Cooling: central Pet: yes 1 dog x 11 yrs  Family History: Family History  Problem Relation Age of Onset   Hypertension Mother    Diabetes Mother    Stroke Mother 55       TIA's   Dementia Mother    Atrial  fibrillation Mother        stent placement   Alzheimer's disease Mother    Sleep apnea Mother    Hypertension Father    Diabetes Father    Hypertension Sister    Diabetes Sister    Thyroid  disease Sister    Sleep apnea Sister    Breast cancer Maternal Aunt 22   Kidney disease Maternal Grandmother        Dialysis   CVA  Maternal Grandmother    Thyroid  disease Maternal Grandmother    Congestive Heart Failure Maternal Grandfather    CVA Maternal Grandfather    Deep vein thrombosis Paternal Grandmother    Asthma Daughter    Review of Systems  Constitutional:  Negative for appetite change, chills, fever and unexpected weight change.  HENT:  Negative for congestion and rhinorrhea.   Eyes:  Negative for itching.  Respiratory:  Negative for cough, chest tightness, shortness of breath and wheezing.   Cardiovascular:  Negative for chest pain.  Gastrointestinal:  Negative for abdominal pain.  Genitourinary:  Negative for difficulty urinating.  Skin:  Negative for rash.  Neurological:  Negative for headaches.    Objective: BP 106/70   Pulse 96   Temp 97.6 F (36.4 C)   Ht 5' 7 (1.702 m)   Wt 223 lb 8 oz (101.4 kg)   SpO2 96%   BMI 35.01 kg/m  Body mass index is 35.01 kg/m. Physical Exam Vitals and nursing note reviewed.  Constitutional:      Appearance: Normal appearance. He is well-developed.  HENT:     Head: Normocephalic and atraumatic.     Right Ear: Tympanic membrane and external ear normal.     Left Ear: Tympanic membrane and external ear normal.     Nose: Nose normal.     Mouth/Throat:     Mouth: Mucous membranes are moist.     Pharynx: Oropharynx is clear.  Eyes:     Conjunctiva/sclera: Conjunctivae normal.  Cardiovascular:     Rate and Rhythm: Normal rate and regular rhythm.     Heart sounds: Normal heart sounds. No murmur heard.    No friction rub. No gallop.  Pulmonary:     Effort: Pulmonary effort is normal.     Breath sounds: Normal breath sounds. No  wheezing, rhonchi or rales.  Musculoskeletal:     Cervical back: Neck supple.  Skin:    General: Skin is warm.     Findings: No rash.  Neurological:     Mental Status: He is alert and oriented to person, place, and time.  Psychiatric:        Behavior: Behavior normal.    The plan was reviewed with the patient/family, and all questions/concerned were addressed.  It was my pleasure to see Jaquavious today and participate in his care. Please feel free to contact me with any questions or concerns.  Sincerely,  Orlan Cramp, DO Allergy & Immunology  [facility]  [facility] office: [phone] [facility] office: [phone]     [1]  Allergies Allergen Reactions   Magnesium Sulfate-Potassium Cl-Sodium Sulfate Hives, Itching and Nausea And Vomiting   Polyethylene Glycol 3350 Hives

## 2024-01-11 NOTE — Patient Instructions (Signed)
°  Today, you were seen for an evaluation of allergic reactions to bowel preparation medications. You experienced hives after taking Sutab and Miralax for previous colonoscopy preparations, which were resolved with Benadryl. You have no prior history of medication allergies or other significant allergic reactions.  YOUR PLAN:  -DRUG ALLERGY TO BOWEL PREPARATION AGENTS: You have experienced acute hives  to Sutab and Miralax, which are used for bowel preparation before a colonoscopy. This means your body reacts negatively to these medications, causing hives. We discussed the possibility of performing a skin test for polyethylene glycol, although it is not well-studied. We also talked about discussing alternative bowel prep agents with your GI doctor, avoiding certain ingredients like sodium sulfate, magnesium sulfate, potassium chloride, and polyethylene glycol. If you agree, we can consider drug challenge testing with alternative agents.   INSTRUCTIONS:  Please follow up with your GI doctor to discuss alternative bowel preparation agents. If you consent, we can perform a skin test for polyethylene glycol with oral challenge if skin testing is negative.   Drug skin testing/challenge instructions: You must be off antihistamines for 3-5 days before. Must be in good health and not ill. No vaccines/injections/antibiotics within the past 7 days.  Plan on being in the office for 2-3 hours and must bring in the drug you want to do the oral challenge for. You must call to schedule an appointment and specify it's for a drug challenge.

## 2024-01-13 DIAGNOSIS — G4733 Obstructive sleep apnea (adult) (pediatric): Secondary | ICD-10-CM | POA: Diagnosis not present

## 2024-04-18 ENCOUNTER — Telehealth: Admitting: Adult Health

## 2024-06-05 ENCOUNTER — Ambulatory Visit: Admitting: Family Medicine
